# Patient Record
Sex: Male | Born: 1965 | Race: White | Hispanic: No | Marital: Married | Smoking: Never smoker
Health system: Southern US, Community
[De-identification: ages and names within clinical notes are randomized; demographics above are authoritative.]

## PROBLEM LIST (undated history)

## (undated) DIAGNOSIS — K5792 Diverticulitis of intestine, part unspecified, without perforation or abscess without bleeding: Secondary | ICD-10-CM

## (undated) DIAGNOSIS — J301 Allergic rhinitis due to pollen: Secondary | ICD-10-CM

## (undated) HISTORY — DX: Allergic rhinitis due to pollen: J30.1

## (undated) HISTORY — DX: Diverticulitis of intestine, part unspecified, without perforation or abscess without bleeding: K57.92

---

## 2010-03-31 ENCOUNTER — Emergency Department: Payer: Self-pay | Admitting: Emergency Medicine

## 2011-01-07 ENCOUNTER — Emergency Department: Payer: Self-pay | Admitting: *Deleted

## 2012-03-05 ENCOUNTER — Emergency Department: Payer: Self-pay | Admitting: Emergency Medicine

## 2012-03-05 LAB — RAPID INFLUENZA A&B ANTIGENS

## 2013-12-08 ENCOUNTER — Emergency Department: Payer: Self-pay | Admitting: Emergency Medicine

## 2013-12-08 LAB — CBC
HCT: 41.9 % (ref 40.0–52.0)
HGB: 13.7 g/dL (ref 13.0–18.0)
MCH: 29.2 pg (ref 26.0–34.0)
MCHC: 32.6 g/dL (ref 32.0–36.0)
MCV: 90 fL (ref 80–100)
Platelet: 253 10*3/uL (ref 150–440)
RBC: 4.68 10*6/uL (ref 4.40–5.90)
RDW: 13.7 % (ref 11.5–14.5)
WBC: 9.5 10*3/uL (ref 3.8–10.6)

## 2013-12-08 LAB — BASIC METABOLIC PANEL
Anion Gap: 6 — ABNORMAL LOW (ref 7–16)
BUN: 14 mg/dL (ref 7–18)
Calcium, Total: 8.4 mg/dL — ABNORMAL LOW (ref 8.5–10.1)
Chloride: 107 mmol/L (ref 98–107)
Co2: 30 mmol/L (ref 21–32)
Creatinine: 0.77 mg/dL (ref 0.60–1.30)
EGFR (African American): >60
EGFR (Non-African Amer.): >60
Glucose: 146 mg/dL — ABNORMAL HIGH (ref 65–99)
Osmolality: 288 (ref 275–301)
Potassium: 4.1 mmol/L (ref 3.5–5.1)
Sodium: 143 mmol/L (ref 136–145)

## 2013-12-08 LAB — TROPONIN I: Troponin-I: 0.03 ng/mL

## 2013-12-08 LAB — PRO B NATRIURETIC PEPTIDE: B-Type Natriuretic Peptide: 50 pg/mL (ref 0–125)

## 2016-01-04 ENCOUNTER — Emergency Department
Admission: EM | Admit: 2016-01-04 | Discharge: 2016-01-04 | Disposition: A | Discharge status: Home / Self Care | Payer: Self-pay | Attending: Emergency Medicine | Admitting: Emergency Medicine

## 2016-01-04 ENCOUNTER — Encounter: Payer: Self-pay | Admitting: *Deleted

## 2016-01-04 DIAGNOSIS — R509 Fever, unspecified: Secondary | ICD-10-CM | POA: Insufficient documentation

## 2016-01-04 DIAGNOSIS — R42 Dizziness and giddiness: Secondary | ICD-10-CM | POA: Insufficient documentation

## 2016-01-04 DIAGNOSIS — R197 Diarrhea, unspecified: Secondary | ICD-10-CM | POA: Insufficient documentation

## 2016-01-04 DIAGNOSIS — Z5321 Procedure and treatment not carried out due to patient leaving prior to being seen by health care provider: Secondary | ICD-10-CM | POA: Insufficient documentation

## 2016-01-04 LAB — COMPREHENSIVE METABOLIC PANEL
ALT: 35 U/L (ref 17–63)
AST: 34 U/L (ref 15–41)
Albumin: 3.9 g/dL (ref 3.5–5.0)
Alkaline Phosphatase: 100 U/L (ref 38–126)
Anion gap: 10 (ref 5–15)
BUN: 13 mg/dL (ref 6–20)
CO2: 26 mmol/L (ref 22–32)
Calcium: 8.3 mg/dL — ABNORMAL LOW (ref 8.9–10.3)
Chloride: 97 mmol/L — ABNORMAL LOW (ref 101–111)
Creatinine, Ser: 0.67 mg/dL (ref 0.61–1.24)
GFR calc Af Amer: >60 mL/min (ref >60)
GFR calc non Af Amer: >60 mL/min (ref >60)
Glucose, Bld: 278 mg/dL — ABNORMAL HIGH (ref 65–99)
Potassium: 3.7 mmol/L (ref 3.5–5.1)
Sodium: 133 mmol/L — ABNORMAL LOW (ref 135–145)
Total Bilirubin: 0.8 mg/dL (ref 0.3–1.2)
Total Protein: 7.5 g/dL (ref 6.5–8.1)

## 2016-01-04 LAB — CBC
HCT: 43.6 % (ref 40.0–52.0)
Hemoglobin: 15.1 g/dL (ref 13.0–18.0)
MCH: 30.2 pg (ref 26.0–34.0)
MCHC: 34.6 g/dL (ref 32.0–36.0)
MCV: 87.3 fL (ref 80.0–100.0)
Platelets: 189 10*3/uL (ref 150–440)
RBC: 5 MIL/uL (ref 4.40–5.90)
RDW: 14.1 % (ref 11.5–14.5)
WBC: 6 10*3/uL (ref 3.8–10.6)

## 2016-01-04 LAB — LIPASE, BLOOD: Lipase: 19 U/L (ref 11–51)

## 2016-01-04 NOTE — ED Provider Notes (Signed)
Nurse notified me that patient left without being seen.  I had not seen him, I reviewed available labs and patient not in room when I went to evaluate/examine.   Governor Rooksebecca Elyjah Hazan, MD 01/04/16 [phone]

## 2016-01-04 NOTE — ED Notes (Signed)
Lab still needs urine sample, showing recieved by accident

## 2016-01-04 NOTE — ED Triage Notes (Signed)
Pt reports nausea, vomiting diarrhea and fever starting yesterday, pt complains of dizziness

## 2016-01-04 NOTE — ED Notes (Addendum)
Patient stated "he is tired of waiting", wife states "we have been waiting too long, we can just go home". This RN tried to encourage patient to wait for MD, he refused, and left without being seen after triage, MD made aware.

## 2016-01-09 ENCOUNTER — Telehealth: Payer: Self-pay | Admitting: Emergency Medicine

## 2016-01-09 NOTE — Telephone Encounter (Signed)
Called patient due to lwot to inquire about condition and follow up plans. Left message.   

## 2016-04-29 ENCOUNTER — Emergency Department
Admission: EM | Admit: 2016-04-29 | Discharge: 2016-04-29 | Disposition: A | Discharge status: Home / Self Care | Payer: Self-pay | Attending: Student in an Organized Health Care Education/Training Program | Admitting: Student in an Organized Health Care Education/Training Program

## 2016-04-29 ENCOUNTER — Encounter: Payer: Self-pay | Admitting: *Deleted

## 2016-04-29 ENCOUNTER — Emergency Department: Payer: Self-pay

## 2016-04-29 DIAGNOSIS — J069 Acute upper respiratory infection, unspecified: Secondary | ICD-10-CM | POA: Insufficient documentation

## 2016-04-29 DIAGNOSIS — B9789 Other viral agents as the cause of diseases classified elsewhere: Secondary | ICD-10-CM

## 2016-04-29 MED ORDER — AMOXICILLIN 500 MG PO TABS: 500.0000 mg | ORAL_TABLET | Freq: Three times a day (TID) | ORAL | 0 refills | Status: DC

## 2016-04-29 MED ORDER — ALBUTEROL SULFATE HFA 108 (90 BASE) MCG/ACT IN AERS: 2.0000 | INHALATION_SPRAY | RESPIRATORY_TRACT | 1 refills | Status: DC | PRN

## 2016-04-29 MED ORDER — ALBUTEROL SULFATE (2.5 MG/3ML) 0.083% IN NEBU
2.5000 mg | INHALATION_SOLUTION | Freq: Once | RESPIRATORY_TRACT | Status: AC
  Administered 2016-04-29: 2.5 mg via RESPIRATORY_TRACT
  Filled 2016-04-29: qty 3

## 2016-04-29 NOTE — ED Triage Notes (Signed)
States he was diagnosed with the flu last week, states he now has sinus pain and ear pain, awake and alert in no acute distress

## 2016-04-29 NOTE — Discharge Instructions (Signed)
Follow up with the primary care provider for symptoms that are not improving over the next few days. ° °Return to the ER for symptoms that change or worsen if unable to schedule an appointment. °

## 2016-04-29 NOTE — ED Provider Notes (Signed)
[facility] Emergency Department Provider Note  ____________________________________________  Time seen: Approximately 8:40 AM  I have reviewed the triage vital signs and the nursing notes.   HISTORY  Chief Complaint Nasal Congestion and Cough   HPI Dustin Mcintyre is a 51 y.o. male who presents to the emergency department for evaluation of cough. He was diagnosed with influenza last week. His mother in all who lives at his house is being admitted for pneumonia and the entire family was "told to come here to be checked out."   History reviewed. No pertinent past medical history.  There are no active problems to display for this patient.   History reviewed. No pertinent surgical history.  Prior to Admission medications   Medication Sig Start Date End Date Taking? Authorizing Provider  albuterol (PROVENTIL HFA;VENTOLIN HFA) 108 (90 Base) MCG/ACT inhaler Inhale 2 puffs into the lungs every 4 (four) hours as needed for wheezing or shortness of breath. 04/29/16   Chinita Pesterari B Tywon Niday, FNP  amoxicillin (AMOXIL) 500 MG tablet Take 1 tablet (500 mg total) by mouth 3 (three) times daily. 04/29/16   Chinita Pesterari B Albirta Rhinehart, FNP    Allergies Patient has no known allergies.  History reviewed. No pertinent family history.  Social History Social History  Substance Use Topics  . Smoking status: Never Smoker  . Smokeless tobacco: Not on file  . Alcohol use No    Review of Systems Constitutional: Positive for fever/chills ENT: Negative sore throat. Cardiovascular: Denies chest pain. Respiratory: Negative for shortness of breath. Positive for cough. Gastrointestinal: Negative for nausea,  Negative for vomiting.  No diarrhea.  Musculoskeletal: Negative for body aches Skin: Positive for lesions on lips Neurological: Negative for headaches ____________________________________________   PHYSICAL EXAM:  VITAL SIGNS: ED Triage Vitals [04/29/16 0758]  Enc Vitals Group   BP (!) 144/91     Pulse Rate 76     Resp 18     Temp 98.5 F (36.9 C)     Temp Source Oral     SpO2 95 %     Weight (!) 350 lb (158.8 kg)     Height 5\' 8"  (1.727 m)     Head Circumference      Peak Flow      Pain Score 5     Pain Loc      Pain Edu?      Excl. in GC?     Constitutional: Alert and oriented. Well appearing and in no acute distress. Eyes: Conjunctivae are normal. EOMI. Ears: Bilateral TM normal. Nose: Nasal congestion noted; no rhinnorhea. Tenderness over the maxillary sinuses bilaterally, worse on the right. Mouth/Throat: Mucous membranes are moist.  Oropharynx normal. Tonsils without exudate. Neck: No stridor.  Lymphatic: No cervical lymphadenopathy. Cardiovascular: Normal rate, regular rhythm. Good peripheral circulation. Respiratory: Normal respiratory effort.  No retractions. Expiratory wheezes noted throughout. Gastrointestinal: Soft and nontender.  Musculoskeletal: FROM x 4 extremities.  Neurologic:  Normal speech and language.  Skin:  Skin is warm, dry and intact. No rash noted. Psychiatric: Mood and affect are normal. Speech and behavior are normal.  ____________________________________________   LABS (all labs ordered are listed, but only abnormal results are displayed)  Labs Reviewed - No data to display ____________________________________________  EKG  Not indicated. ____________________________________________  RADIOLOGY  Chest x-ray negative for acute abnormality. ____________________________________________   PROCEDURES  Procedure(s) performed: None  Critical Care performed: No ____________________________________________   INITIAL IMPRESSION / ASSESSMENT AND PLAN / ED COURSE  51 year old male presenting to  the emergency department to rule out pneumonia. Chest x-ray negative for sign of infiltrate. Patient received albuterol nebulized treatment while in the emergency department with scant decrease in wheezes. He'll be given  prescription for albuterol. He was encouraged to follow up with the primary care provider if his choice for symptoms that are not improving over the next few days. He is instructed to return to emergency department for symptoms change or worsen if he is unable schedule an appointment.  Pertinent labs & imaging results that were available during my care of the patient were reviewed by me and considered in my medical decision making (see chart for details).  Discharge Medication List as of 04/29/2016  9:25 AM    START taking these medications   Details  albuterol (PROVENTIL HFA;VENTOLIN HFA) 108 (90 Base) MCG/ACT inhaler Inhale 2 puffs into the lungs every 4 (four) hours as needed for wheezing or shortness of breath., Starting Tue 04/29/2016, Print        If controlled substance prescribed during this visit, 12 month history viewed on the [place] prior to issuing an initial prescription for Schedule II or II opiod. ____________________________________________   FINAL CLINICAL IMPRESSION(S) / ED DIAGNOSES  Final diagnoses:  Viral URI with cough    Note:  This document was prepared using Dragon voice recognition software and may include unintentional dictation errors.     Chinita Pester, FNP 04/30/16 1610    Willy Eddy, MD 04/30/16 1501

## 2016-04-29 NOTE — ED Notes (Signed)
Pt to ed with c/o diagnosed with flu last week,  Reports he used z pack but then started with fever, and cough again on Saturday PM.

## 2017-03-17 ENCOUNTER — Emergency Department
Admission: EM | Admit: 2017-03-17 | Discharge: 2017-03-17 | Disposition: A | Discharge status: Home / Self Care | Payer: Self-pay | Attending: Emergency Medicine | Admitting: Emergency Medicine

## 2017-03-17 ENCOUNTER — Encounter: Payer: Self-pay | Admitting: Medical Oncology

## 2017-03-17 ENCOUNTER — Emergency Department: Payer: Self-pay

## 2017-03-17 DIAGNOSIS — J209 Acute bronchitis, unspecified: Secondary | ICD-10-CM | POA: Insufficient documentation

## 2017-03-17 LAB — CBC WITH DIFFERENTIAL/PLATELET
Basophils Absolute: 0.1 10*3/uL (ref 0–0.1)
Basophils Relative: 1 %
Eosinophils Absolute: 0.2 10*3/uL (ref 0–0.7)
Eosinophils Relative: 3 %
HCT: 42.4 % (ref 40.0–52.0)
Hemoglobin: 14.3 g/dL (ref 13.0–18.0)
Lymphocytes Relative: 29 %
Lymphs Abs: 2.6 10*3/uL (ref 1.0–3.6)
MCH: 29.8 pg (ref 26.0–34.0)
MCHC: 33.8 g/dL (ref 32.0–36.0)
MCV: 88.2 fL (ref 80.0–100.0)
Monocytes Absolute: 0.8 10*3/uL (ref 0.2–1.0)
Monocytes Relative: 8 %
Neutro Abs: 5.5 10*3/uL (ref 1.4–6.5)
Neutrophils Relative %: 59 %
Platelets: 263 10*3/uL (ref 150–440)
RBC: 4.81 MIL/uL (ref 4.40–5.90)
RDW: 13.7 % (ref 11.5–14.5)
WBC: 9.2 10*3/uL (ref 3.8–10.6)

## 2017-03-17 LAB — COMPREHENSIVE METABOLIC PANEL
ALT: 49 U/L (ref 17–63)
AST: 43 U/L — ABNORMAL HIGH (ref 15–41)
Albumin: 4.1 g/dL (ref 3.5–5.0)
Alkaline Phosphatase: 108 U/L (ref 38–126)
Anion gap: 11 (ref 5–15)
BUN: 15 mg/dL (ref 6–20)
CO2: 25 mmol/L (ref 22–32)
Calcium: 8.9 mg/dL (ref 8.9–10.3)
Chloride: 103 mmol/L (ref 101–111)
Creatinine, Ser: 0.59 mg/dL — ABNORMAL LOW (ref 0.61–1.24)
GFR calc Af Amer: >60 mL/min (ref >60)
GFR calc non Af Amer: >60 mL/min (ref >60)
Glucose, Bld: 188 mg/dL — ABNORMAL HIGH (ref 65–99)
Potassium: 3.8 mmol/L (ref 3.5–5.1)
Sodium: 139 mmol/L (ref 135–145)
Total Bilirubin: 0.6 mg/dL (ref 0.3–1.2)
Total Protein: 7.7 g/dL (ref 6.5–8.1)

## 2017-03-17 LAB — INFLUENZA PANEL BY PCR (TYPE A & B)
Influenza A By PCR: NEGATIVE
Influenza B By PCR: NEGATIVE

## 2017-03-17 MED ORDER — AZITHROMYCIN 250 MG PO TABS: ORAL_TABLET | ORAL | 0 refills | Status: AC

## 2017-03-17 MED ORDER — PREDNISONE 50 MG PO TABS: ORAL_TABLET | ORAL | 0 refills | Status: DC

## 2017-03-17 NOTE — ED Provider Notes (Signed)
[facility] Emergency Department Provider Note  ____________________________________________  Time seen: Approximately 6:24 PM  I have reviewed the triage vital signs and the nursing notes.   HISTORY  Chief Complaint Influenza    HPI Dustin Mcintyre is a 52 y.o. male presenting to the emergency department with 3 weeks of productive cough, fever, intermittent diarrhea, myalgias and vertigo.  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  Patient reports that numerous household members have had similar symptoms.  Patient reports that he originally sought care at urgent care, who diagnosed him with a viral upper respiratory tract infection.  He reports intermittent shortness of breath and chest tightness but no nausea, vomiting or abdominal pain.  Patient has been taking Sudafed.   History reviewed. No pertinent past medical history.  There are no active problems to display for this patient.   History reviewed. No pertinent surgical history.  Prior to Admission medications   Medication Sig Start Date End Date Taking? Authorizing Provider  albuterol (PROVENTIL HFA;VENTOLIN HFA) 108 (90 Base) MCG/ACT inhaler Inhale 2 puffs into the lungs every 4 (four) hours as needed for wheezing or shortness of breath. 04/29/16   Triplett, Rulon Eisenmenger B, FNP  amoxicillin (AMOXIL) 500 MG tablet Take 1 tablet (500 mg total) by mouth 3 (three) times daily. 04/29/16   Triplett, Rulon Eisenmenger B, FNP  azithromycin (ZITHROMAX Z-PAK) 250 MG tablet Take 2 tablets (500 mg) on  Day 1,  followed by 1 tablet (250 mg) once daily on Days 2 through 5. 03/17/17 03/22/17  Orvil Feil, PA-C  predniSONE (DELTASONE) 50 MG tablet Take one 50 mg tablet once a day for 5 days. 03/17/17   Orvil Feil, PA-C    Allergies Patient has no known allergies.  No family history on file.  Social History Social History   Tobacco Use  . Smoking status: Never Smoker   Substance Use Topics  . Alcohol use: No  . Drug use: Not on file      Review of Systems  Constitutional: Patient has fever.  Eyes: No visual changes. No discharge ENT: Patient has congestion.  Cardiovascular: no chest pain. Respiratory: Patient has cough.  Gastrointestinal: No abdominal pain.  No nausea, no vomiting. Patient had diarrhea.  Genitourinary: Negative for dysuria. No hematuria Musculoskeletal: Patient has myalgias.  Skin: Negative for rash, abrasions, lacerations, ecchymosis. Neurological: Patient has headache, no focal weakness or numbness.     ____________________________________________   PHYSICAL EXAM:  VITAL SIGNS: ED Triage Vitals  Enc Vitals Group     BP 03/17/17 1725 (!) 143/89     Pulse Rate 03/17/17 1725 82     Resp 03/17/17 1725 20     Temp 03/17/17 1725 98.2 F (36.8 C)     Temp Source 03/17/17 1725 Oral     SpO2 03/17/17 1725 96 %     Weight 03/17/17 1725 (!) 360 lb (163.3 kg)     Height 03/17/17 1725 5\' 8"  (1.727 m)     Head Circumference --      Peak Flow --      Pain Score 03/17/17 1727 7     Pain Loc --      Pain Edu? --      Excl. in GC? --      Constitutional: Alert and oriented. Patient is lying supine. Eyes: Conjunctivae are normal. PERRL. EOMI. Head: Atraumatic. ENT:      Ears: Tympanic membranes are mildly injected with  mild effusion bilaterally.       Nose: No congestion/rhinnorhea.      Mouth/Throat: Mucous membranes are moist. Posterior pharynx is mildly erythematous.  Hematological/Lymphatic/Immunilogical: No cervical lymphadenopathy.  Cardiovascular: Normal rate, regular rhythm. Normal S1 and S2.  Good peripheral circulation. Respiratory: Normal respiratory effort without tachypnea or retractions. Lungs CTAB. Good air entry to the bases with no decreased or absent breath sounds. Gastrointestinal: Bowel sounds 4 quadrants. Soft and nontender to palpation. No guarding or rigidity. No palpable masses. No distention. No  CVA tenderness. Musculoskeletal: Full range of motion to all extremities. No gross deformities appreciated. Neurologic:  Normal speech and language. No gross focal neurologic deficits are appreciated.  Skin:  Skin is warm, dry and intact. No rash noted. Psychiatric: Mood and affect are normal. Speech and behavior are normal. Patient exhibits appropriate insight and judgement.    ____________________________________________   LABS (all labs ordered are listed, but only abnormal results are displayed)  Labs Reviewed  COMPREHENSIVE METABOLIC PANEL - Abnormal; Notable for the following components:      Result Value   Glucose, Bld 188 (*)    Creatinine, Ser 0.59 (*)    AST 43 (*)    All other components within normal limits  INFLUENZA PANEL BY PCR (TYPE A & B)  CBC WITH DIFFERENTIAL/PLATELET   ____________________________________________  EKG   ____________________________________________  RADIOLOGY Geraldo Pitter, personally viewed and evaluated these images (plain radiographs) as part of my medical decision making, as well as reviewing the written report by the radiologist.    Dg Chest 2 View  Result Date: 03/17/2017 CLINICAL DATA:  52 year old male with fever, vomiting and diarrhea EXAM: CHEST  2 VIEW COMPARISON:  Prior chest x-ray 04/29/2016 FINDINGS: Stable borderline cardiomegaly. Mediastinal contours remain unchanged. Chronic bibasilar interstitial prominence appears similar compared to prior and is likely exaggerated by superimposed overlying soft tissues. No focal airspace consolidation, pleural effusion, pulmonary edema or pneumothorax. No acute osseous abnormality. IMPRESSION: Stable chest x-ray without evidence of acute cardiopulmonary process. Electronically Signed   By: Malachy Moan M.D.   On: 03/17/2017 18:47    ____________________________________________    PROCEDURES  Procedure(s) performed:    Procedures    Medications - No data to  display   ____________________________________________   INITIAL IMPRESSION / ASSESSMENT AND PLAN / ED COURSE  Pertinent labs & imaging results that were available during my care of the patient were reviewed by me and considered in my medical decision making (see chart for details).  Review of the [place] CSRS was performed in accordance of the [facility] prior to dispensing any controlled drugs.     Assessment and Plan: Acute bronchitis Patient presented to the emergency department with cough productive for purulent sputum production and shortness of breath after having URI symptoms for the past 3 weeks.  Differential diagnosis included community-acquired pneumonia, acute bronchitis, viral URI and vertigo.  CBC and CMP were conducted given vertigo symptoms.  CBC and CMP were reassuring.  Patient was treated empirically for acute bronchitis with azithromycin and prednisone.  Vital signs were reassuring prior to discharge.  All patient questions were answered.    ____________________________________________  FINAL CLINICAL IMPRESSION(S) / ED DIAGNOSES  Final diagnoses:  Acute bronchitis, unspecified organism      NEW MEDICATIONS STARTED DURING THIS VISIT:  ED Discharge Orders        Ordered    azithromycin (ZITHROMAX Z-PAK) 250 MG tablet     03/17/17 1945    predniSONE (DELTASONE) 50  MG tablet     03/17/17 1945          This chart was dictated using voice recognition software/Dragon. Despite best efforts to proofread, errors can occur which can change the meaning. Any change was purely unintentional.    Orvil FeilWoods, Jaclyn M, PA-C 03/17/17 2351    Sharyn CreamerQuale, Mark, MD 03/18/17 [phone]

## 2017-03-17 NOTE — ED Notes (Signed)
See triage note  Presents with stomach virus that started about 3 weeks ago.now having subjective fever and vomiting  Last time vomited was at 11 am  And last diarrhea was yesterday   Afebrile on arrival

## 2017-03-17 NOTE — ED Notes (Signed)
Patient transported to X-ray 

## 2017-03-17 NOTE — ED Triage Notes (Addendum)
Flu like sx's for over a week. Reports of sinus pressure and headache.

## 2017-06-08 ENCOUNTER — Emergency Department
Admission: EM | Admit: 2017-06-08 | Discharge: 2017-06-08 | Disposition: A | Discharge status: Home / Self Care | Payer: Self-pay | Attending: Emergency Medicine | Admitting: Emergency Medicine

## 2017-06-08 ENCOUNTER — Other Ambulatory Visit: Payer: Self-pay

## 2017-06-08 ENCOUNTER — Encounter: Payer: Self-pay | Admitting: Emergency Medicine

## 2017-06-08 ENCOUNTER — Emergency Department: Payer: Self-pay

## 2017-06-08 DIAGNOSIS — J40 Bronchitis, not specified as acute or chronic: Secondary | ICD-10-CM | POA: Insufficient documentation

## 2017-06-08 DIAGNOSIS — Z79899 Other long term (current) drug therapy: Secondary | ICD-10-CM | POA: Insufficient documentation

## 2017-06-08 DIAGNOSIS — J069 Acute upper respiratory infection, unspecified: Secondary | ICD-10-CM | POA: Insufficient documentation

## 2017-06-08 LAB — GROUP A STREP BY PCR: Group A Strep by PCR: NOT DETECTED

## 2017-06-08 MED ORDER — ALBUTEROL SULFATE HFA 108 (90 BASE) MCG/ACT IN AERS: 2.0000 | INHALATION_SPRAY | Freq: Four times a day (QID) | RESPIRATORY_TRACT | 0 refills | Status: DC | PRN

## 2017-06-08 MED ORDER — GUAIFENESIN ER 600 MG PO TB12: 600.0000 mg | ORAL_TABLET | Freq: Two times a day (BID) | ORAL | 0 refills | Status: AC

## 2017-06-08 MED ORDER — AMOXICILLIN-POT CLAVULANATE 875-125 MG PO TABS: 1.0000 | ORAL_TABLET | Freq: Two times a day (BID) | ORAL | 0 refills | Status: AC

## 2017-06-08 NOTE — ED Notes (Signed)
ED Provider at bedside. 

## 2017-06-08 NOTE — ED Notes (Signed)
Pt states he has difficulty breathing, throat swollen, HA over eyes, and ears are ringing. Symptoms started on Thursday. Pt is alert and oriented x 4.

## 2017-06-08 NOTE — ED Notes (Signed)
Pt alert and oriented X4, active, cooperative, pt in NAD. RR even and unlabored, color WNL.  Pt informed to return if any life threatening symptoms occur.  Discharge and followup instructions reviewed.  

## 2017-06-08 NOTE — ED Provider Notes (Signed)
[facility] Emergency Department Provider Note  Time seen: 7:19 AM  I have reviewed the triage vital signs and the nursing notes.   HISTORY  Chief Complaint Cough; Ear Fullness; and Nasal Congestion    HPI Dustin Mcintyre is a 52 y.o. male with no significant past medical history, presents to the emergency department for cough, congestion subjective fever.  According to the patient over the past 3-4 days he has had subjective fever cough congestion and is throat ears and chest.  States he is now getting out a very yellow-green sputum when he coughs.  Noticed he was wheezing at home.  Denies any history of COPD or asthma.  Denies any chest pain or abdominal pain, vomiting or diarrhea, largely negative review of systems otherwise.   History reviewed. No pertinent past medical history.  There are no active problems to display for this patient.   History reviewed. No pertinent surgical history.  Prior to Admission medications   Medication Sig Start Date End Date Taking? Authorizing Provider  albuterol (PROVENTIL HFA;VENTOLIN HFA) 108 (90 Base) MCG/ACT inhaler Inhale 2 puffs into the lungs every 4 (four) hours as needed for wheezing or shortness of breath. 04/29/16   Triplett, Rulon Eisenmengerari B, FNP  amoxicillin (AMOXIL) 500 MG tablet Take 1 tablet (500 mg total) by mouth 3 (three) times daily. 04/29/16   Triplett, Rulon Eisenmengerari B, FNP  predniSONE (DELTASONE) 50 MG tablet Take one 50 mg tablet once a day for 5 days. 03/17/17   Orvil FeilWoods, Jaclyn M, PA-C    No Known Allergies  History reviewed. No pertinent family history.  Social History Social History   Tobacco Use  . Smoking status: Never Smoker  . Smokeless tobacco: Current User    Types: Chew  Substance Use Topics  . Alcohol use: No  . Drug use: Never    Review of Systems Constitutional: Subjective fever Eyes: Negative for visual complaints ENT: Nasal and chest congestion.  Ear pressure. Cardiovascular: Negative for chest  pain. Respiratory: Mild shortness of breath.  Positive for cough. Gastrointestinal: Negative for abdominal pain, vomiting  Genitourinary: Negative for urinary compaints Musculoskeletal: Negative for leg pain or swelling Skin: Negative for skin complaints  Neurological: Negative for headache All other ROS negative  ____________________________________________   PHYSICAL EXAM:  VITAL SIGNS: ED Triage Vitals  Enc Vitals Group     BP 06/08/17 0432 (!) 141/76     Pulse Rate 06/08/17 0432 88     Resp 06/08/17 0432 18     Temp 06/08/17 0432 98.6 F (37 C)     Temp Source 06/08/17 0432 Oral     SpO2 06/08/17 0432 95 %     Weight 06/08/17 0433 (!) 370 lb (167.8 kg)     Height 06/08/17 0433 5\' 8"  (1.727 m)     Head Circumference --      Peak Flow --      Pain Score 06/08/17 0433 6     Pain Loc --      Pain Edu? --      Excl. in GC? --     Constitutional: Alert and oriented. Well appearing and in no distress. Eyes: Normal exam ENT   Head: Normocephalic and atraumatic.  Normal appearing tympanic membranes.   Nose: Moderate congestion.   Mouth/Throat: Mucous membranes are moist. Cardiovascular: Normal rate, regular rhythm. No murmur Respiratory: Normal respiratory effort without tachypnea nor retractions.  Slight expiratory wheeze bilaterally.  Frequent cough during exam. Gastrointestinal: Soft and nontender. No distention.  Obese. Musculoskeletal: Nontender with normal range of motion in all extremities. No lower extremity tenderness Neurologic:  Normal speech and language. No gross focal neurologic deficits  Skin:  Skin is warm, dry and intact.  Psychiatric: Mood and affect are normal. ____________________________________________   RADIOLOGY  Mild bronchitic changes on chest x-ray  ____________________________________________   INITIAL IMPRESSION / ASSESSMENT AND PLAN / ED COURSE  Pertinent labs & imaging results that were available during my care of the  patient were reviewed by me and considered in my medical decision making (see chart for details).  Patient presents the emergency department for 4 days of subjective fever cough congestion with sputum production.  Differential includes upper respiratory infection, allergies, pneumonia, streptococcal infection.  Strep swab negative.  Chest x-ray negative besides bronchitic changes.  Highly suspect bronchitis.  Given the patient's slightly lower O2 saturation on room air 92%, currently 96% during my evaluation as well as slight expiratory wheeze cough congestion we will cover with antibiotics, placed on guaifenesin, albuterol and have the patient follow-up with his doctor in the next several days for recheck/reevaluation.  Patient agreeable to this plan of care.  Discussed return precautions.  ____________________________________________   FINAL CLINICAL IMPRESSION(S) / ED DIAGNOSES  Upper respiratory infection Bronchitis    Minna Antis, MD 06/08/17 [phone]

## 2017-06-08 NOTE — ED Triage Notes (Signed)
Patient ambulatory to triage with complaints of productive cough (yellow), nasal congestion, and ears "cracking" since Friday. Constant aching in throat 6/10.  Pt reports s/sx started with chills and fever.  Pt reports taking OTC medications with little effect.   Pt here with wife having similar s/sx. Speaking in complete coherent sentences. No acute breathing distress noted.

## 2017-11-02 ENCOUNTER — Other Ambulatory Visit: Payer: Self-pay

## 2017-11-02 ENCOUNTER — Emergency Department
Admission: EM | Admit: 2017-11-02 | Discharge: 2017-11-02 | Disposition: A | Discharge status: Home / Self Care | Payer: Self-pay | Attending: Emergency Medicine | Admitting: Emergency Medicine

## 2017-11-02 ENCOUNTER — Encounter: Payer: Self-pay | Admitting: Emergency Medicine

## 2017-11-02 DIAGNOSIS — Y9389 Activity, other specified: Secondary | ICD-10-CM | POA: Insufficient documentation

## 2017-11-02 DIAGNOSIS — Y929 Unspecified place or not applicable: Secondary | ICD-10-CM | POA: Insufficient documentation

## 2017-11-02 DIAGNOSIS — S0502XA Injury of conjunctiva and corneal abrasion without foreign body, left eye, initial encounter: Secondary | ICD-10-CM | POA: Insufficient documentation

## 2017-11-02 DIAGNOSIS — W228XXA Striking against or struck by other objects, initial encounter: Secondary | ICD-10-CM | POA: Insufficient documentation

## 2017-11-02 DIAGNOSIS — Y999 Unspecified external cause status: Secondary | ICD-10-CM | POA: Insufficient documentation

## 2017-11-02 DIAGNOSIS — H00024 Hordeolum internum left upper eyelid: Secondary | ICD-10-CM | POA: Insufficient documentation

## 2017-11-02 MED ORDER — MOXIFLOXACIN HCL 0.5 % OP SOLN: 1.0000 [drp] | Freq: Three times a day (TID) | OPHTHALMIC | 0 refills | Status: AC

## 2017-11-02 MED ORDER — KETOROLAC TROMETHAMINE 0.5 % OP SOLN: 1.0000 [drp] | Freq: Four times a day (QID) | OPHTHALMIC | 0 refills | Status: DC

## 2017-11-02 MED ORDER — TETRACAINE HCL 0.5 % OP SOLN
1.0000 [drp] | Freq: Once | OPHTHALMIC | Status: AC
  Administered 2017-11-02: 1 [drp] via OPHTHALMIC
  Filled 2017-11-02: qty 4

## 2017-11-02 MED ORDER — FLUORESCEIN SODIUM 1 MG OP STRP
1.0000 | ORAL_STRIP | Freq: Once | OPHTHALMIC | Status: AC
  Administered 2017-11-02: 1 via OPHTHALMIC
  Filled 2017-11-02: qty 1

## 2017-11-02 MED ORDER — EYE WASH OPHTH SOLN
1.0000 [drp] | OPHTHALMIC | Status: DC | PRN
  Filled 2017-11-02: qty 118

## 2017-11-02 NOTE — ED Notes (Signed)
See triage note  Presents with pain to left eye  States he felt like he has gotten something in left eye 2 days ago  Left eye slightly irritated and watering

## 2017-11-02 NOTE — ED Provider Notes (Signed)
[facility] Emergency Department Provider Note ____________________________________________  Time seen: 1309  I have reviewed the triage vital signs and the nursing notes.  HISTORY  Chief Complaint  Eye Problem  HPI Dustin Mcintyre is a 52 y.o. male resents to the ED for 2-day complaint of left eye irritation.  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  Since that time he is flush the eye and is having persistent symptoms.  He reports excessive tearing but denies any visual disturbance, nausea, vomiting, or dizziness.  He denies any crusting or matting.  He notes intermittent foreign body sensation and tenderness to the upper lid.  No other complaints are reported.  History reviewed. No pertinent past medical history.  There are no active problems to display for this patient.  History reviewed. No pertinent surgical history.  Prior to Admission medications   Medication Sig Start Date End Date Taking? Authorizing Provider  albuterol (PROVENTIL HFA;VENTOLIN HFA) 108 (90 Base) MCG/ACT inhaler Inhale 2 puffs into the lungs every 6 (six) hours as needed for wheezing or shortness of breath. 06/08/17   Minna Antis, MD  ketorolac (ACULAR) 0.5 % ophthalmic solution Place 1 drop into the left eye 4 (four) times daily. 11/02/17   Jesiah Yerby, Charlesetta Ivory, PA-C  moxifloxacin (VIGAMOX) 0.5 % ophthalmic solution Place 1 drop into the left eye 3 (three) times daily for 7 days. 11/02/17 11/09/17  Jasimine Simms, Charlesetta Ivory, PA-C    Allergies Patient has no known allergies.  History reviewed. No pertinent family history.  Social History Social History   Tobacco Use  . Smoking status: Never Smoker  . Smokeless tobacco: Current User    Types: Chew  Substance Use Topics  . Alcohol use: No  . Drug use: Never    Review of Systems  Constitutional: Negative for fever. Eyes: Negative for visual  changes. ENT: Negative for sore throat. Cardiovascular: Negative for chest pain. Respiratory: Negative for shortness of breath. Gastrointestinal: Negative for abdominal pain, vomiting and diarrhea. Genitourinary: Negative for dysuria. Musculoskeletal: Negative for back pain. Skin: Negative for rash. Neurological: Negative for headaches, focal weakness or numbness. ____________________________________________  PHYSICAL EXAM:  VITAL SIGNS: ED Triage Vitals  Enc Vitals Group     BP 11/02/17 1224 (!) 144/91     Pulse Rate 11/02/17 1224 92     Resp 11/02/17 1224 20     Temp 11/02/17 1224 97.7 F (36.5 C)     Temp Source 11/02/17 1224 Oral     SpO2 11/02/17 1224 95 %     Weight 11/02/17 1224 (!) 350 lb (158.8 kg)     Height 11/02/17 1224 5\' 8"  (1.727 m)     Head Circumference --      Peak Flow --      Pain Score 11/02/17 1227 10     Pain Loc --      Pain Edu? --      Excl. in GC? --     Constitutional: Alert and oriented. Well appearing and in no distress. Head: Normocephalic and atraumatic. Eyes: Conjunctivae are normal. PERRL. Normal extraocular movements.  Left eye with minimal upper lid erythema noted.  Upper lid reveals a small inclusion cyst to the lateral aspect of the inner lid margin.  There is floor seen dye over the cornea at about the 2 o'clock position.  No gross foreign body is appreciated. Hematological/Lymphatic/Immunological: No preauricular lymphadenopathy. Cardiovascular:  Normal rate, regular rhythm. Normal distal pulses. Respiratory: Normal respiratory effort.  Musculoskeletal: Nontender with normal range of motion in all extremities.  Neurologic:  Normal gait without ataxia. Normal speech and language. No gross focal neurologic deficits are appreciated. Skin:  Skin is warm, dry and intact. No rash noted. ____________________________________________  PROCEDURES  Procedures Tetracaine ii gtts OS ____________________________________________  INITIAL  IMPRESSION / ASSESSMENT AND PLAN / ED COURSE  She with ED evaluation of left eye foreign body complaint.  His exam reveals a corneal abrasion to the left eyes well as an early internal hordeolum to the lid margin.  Patient will be discharged with prescriptions for Vigamox as well as Acular.  He will follow-up with his eye care professional or [facility] for ongoing symptoms. ____________________________________________  FINAL CLINICAL IMPRESSION(S) / ED DIAGNOSES  Final diagnoses:  Abrasion of left cornea, initial encounter  Hordeolum internum of left upper eyelid      Karmen Stabs, Charlesetta Ivory, PA-C 11/02/17 1315    Emily Filbert, MD 11/02/17 1429

## 2017-11-02 NOTE — ED Triage Notes (Signed)
Feels like something got in eye a couple days ago.  Pain to left eye. Watering.

## 2017-11-02 NOTE — Discharge Instructions (Addendum)
You have a superficial abrasion to the left eye at the 2 o'clock position. You also have a small stye to the inside of the upper lid. Use the eye antibiotic and eye pain medicine drops as directed. Follow-up with your eye care professional or Dr. Druscilla Brownie as needed.

## 2018-04-12 ENCOUNTER — Ambulatory Visit: Payer: Self-pay | Admitting: Family Medicine

## 2018-04-19 ENCOUNTER — Encounter (INDEPENDENT_AMBULATORY_CARE_PROVIDER_SITE_OTHER): Payer: Self-pay

## 2018-04-19 ENCOUNTER — Encounter: Payer: Self-pay | Admitting: Family Medicine

## 2018-04-19 ENCOUNTER — Ambulatory Visit: Payer: PRIVATE HEALTH INSURANCE | Admitting: Family Medicine

## 2018-04-19 DIAGNOSIS — Z23 Encounter for immunization: Secondary | ICD-10-CM | POA: Diagnosis not present

## 2018-04-19 DIAGNOSIS — Z6841 Body Mass Index (BMI) 40.0 and over, adult: Secondary | ICD-10-CM

## 2018-04-19 DIAGNOSIS — R030 Elevated blood-pressure reading, without diagnosis of hypertension: Secondary | ICD-10-CM

## 2018-04-19 DIAGNOSIS — R202 Paresthesia of skin: Secondary | ICD-10-CM

## 2018-04-19 DIAGNOSIS — Z1211 Encounter for screening for malignant neoplasm of colon: Secondary | ICD-10-CM

## 2018-04-19 DIAGNOSIS — I83811 Varicose veins of right lower extremities with pain: Secondary | ICD-10-CM

## 2018-04-19 DIAGNOSIS — R200 Anesthesia of skin: Secondary | ICD-10-CM

## 2018-04-19 NOTE — Progress Notes (Signed)
Subjective:    Patient ID: Dustin Mcintyre, male    DOB: 1966/01/26, 53 y.o.   MRN: [id number]  HPI   Patient presents to clinic to establish with PCP.  He has multiple complaint/requests.  First is a history of diverticulitis.  States he had a bout of it a few weeks ago, feels his stomach is finally getting back to normal.  Did not go to a walk-in or urgent care or ER for evaluation, self diagnosed most recent diverticulitis flareup.  Got rid of symptoms by just eating a bland diet and monitoring self.  [name] last colonoscopy was approximately 10 to 12 years ago, patient has not 100% sure.  We will get new referral in place for patient to have colonoscopy.  Patient would like gastric bypass or lap band surgery.  States he has a hard time exercising due to his job and does not feel he can lose weight any other way.  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  States while on the road he often eats at gas stations, will have hotdogs or subs/hoagies.  Patient states he does not have time to work out due to his job schedule.  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  Patient also reports pain in numbness in his right leg.  States he injured his right leg about 20 years ago, fell through a great scraping skin off the right leg.  States ever since then he has had episodes of right leg going numb off and on, seems to be getting worse even his toes will go numb now - numbness usually occurs after sitting for long periods.  Patient also noticed some varicose veins in his right lower extremity and is concerned about this.  Past Medical History:  Diagnosis Date  . Diverticulitis   . Hay fever    Social History   Tobacco Use  . Smoking status: Never Smoker  . Smokeless tobacco: Current User    Types: Chew  Substance Use Topics  . Alcohol use: Yes   History reviewed. No pertinent  surgical history.  Family History  Problem Relation Age of Onset  . Hypertension Father   . Hypertension Paternal Uncle    Review of Systems  Constitutional: Negative for chills, fatigue and fever.  HENT: Negative for congestion, ear pain, sinus pain and sore throat.   Eyes: Negative.   Respiratory: Negative for cough, shortness of breath and wheezing.   Cardiovascular: Negative for chest pain, palpitations and leg swelling.  Gastrointestinal: Negative for abdominal pain, diarrhea, nausea and vomiting. Recent diverticulitis flare.  Genitourinary: Negative for dysuria, frequency and urgency.  Musculoskeletal: +right leg pain/numbness  Skin: Negative for color change, pallor and rash.  Neurological: Negative for syncope, light-headedness and headaches.  Psychiatric/Behavioral: The patient is not nervous/anxious.       Objective:   Physical Exam Vitals signs and nursing note reviewed.  Constitutional:      General: He is not in acute distress.    Appearance: He is obese. He is not toxic-appearing.  HENT:     Head: Normocephalic and atraumatic.     Right Ear: Tympanic membrane, ear canal and external ear normal.     Left Ear: Tympanic membrane, ear canal and external ear normal.     Nose: Nose normal.     Mouth/Throat:     Mouth: Mucous membranes are moist.  Pharynx: Oropharynx is clear.  Eyes:     General: No scleral icterus.    Extraocular Movements: Extraocular movements intact.     Conjunctiva/sclera: Conjunctivae normal.     Pupils: Pupils are equal, round, and reactive to light.  Neck:     Musculoskeletal: Neck supple. No neck rigidity or muscular tenderness.  Cardiovascular:     Rate and Rhythm: Normal rate and regular rhythm.     Comments: No LE edema.  Visible varicose veins on right inner thigh.  Pulmonary:     Effort: Pulmonary effort is normal. No respiratory distress.     Breath sounds: Normal breath sounds.  Abdominal:     General: Bowel sounds are  normal. There is no distension.     Palpations: Abdomen is soft.     Tenderness: There is no abdominal tenderness.  Lymphadenopathy:     Cervical: No cervical adenopathy.  Skin:    General: Skin is warm and dry.     Coloration: Skin is not jaundiced or pale.  Neurological:     Mental Status: He is alert and oriented to person, place, and time.     Motor: No weakness.     Gait: Gait normal.     Comments: Grips, quadricep strength, dorsi plantar flexion equal and strong.  Light touch sensation intact in upper and lower extremities.   Psychiatric:        Mood and Affect: Mood normal.        Behavior: Behavior normal.    Vitals:   04/19/18 0853  BP: (!) 158/96  Pulse: 84  Resp: 18  Temp: 98.1 F (36.7 C)  SpO2: 94%   Wt Readings from Last 3 Encounters:  04/19/18 (!) 354 lb 9.6 oz (160.8 kg)  11/02/17 (!) 350 lb (158.8 kg)  06/08/17 (!) 370 lb (167.8 kg)   Body mass index is 57.23 kg/m.     Assessment & Plan:   Morbid obesity - referral to bariatric surgery given.  Long discussion with patient about healthy diet choices, and that gastric bypass or lap band surgery is not a quick or easy fix for weight loss.  Even with a gastric bypass or lap band surgery you must follow a strict diet and do exercise.  Discussed a diet with lean proteins, lots of vegetables and avoiding excess carbohydrates and sugars.  Discussed drinking plenty of water.  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  Numbness and pain of leg/varicose veins in right lower extremity - due to numbness and tingling of right leg we will refer to neurology for nerve conduction study.  We will also do vascular study of right lower extremity to evaluate blood flow.  Screening colonoscopy referral made due to patient's history of diverticulitis and not having a colonoscopy for the  past 10 to 12 years.  Tetanus booster given in clinic today.  Blood work collected in clinic today.  Elevated BP without dx of HTN - Blood pressure is a little elevated, patient states he monitors this at home and usually he runs in the 120s over 80s.  He will keep an eye on this.  And we will follow-up with blood pressure at next visit.  Patient will follow-up in approximately 4 to 6 weeks for recheck on blood pressure, weight.  Advised he can return to clinic sooner if any issues arise.

## 2018-04-21 ENCOUNTER — Telehealth: Payer: Self-pay

## 2018-04-21 DIAGNOSIS — E119 Type 2 diabetes mellitus without complications: Secondary | ICD-10-CM

## 2018-04-21 LAB — COMPREHENSIVE METABOLIC PANEL
ALT: 48 IU/L — ABNORMAL HIGH (ref 0–44)
AST: 21 IU/L (ref 0–40)
Albumin/Globulin Ratio: 1.7 (ref 1.2–2.2)
Albumin: 4.5 g/dL (ref 3.8–4.9)
Alkaline Phosphatase: 171 IU/L — ABNORMAL HIGH (ref 39–117)
BUN/Creatinine Ratio: 16 (ref 9–20)
BUN: 12 mg/dL (ref 6–24)
Bilirubin Total: 0.3 mg/dL (ref 0.0–1.2)
CO2: 26 mmol/L (ref 20–29)
Calcium: 9.7 mg/dL (ref 8.7–10.2)
Chloride: 95 mmol/L — ABNORMAL LOW (ref 96–106)
Creatinine, Ser: 0.73 mg/dL — ABNORMAL LOW (ref 0.76–1.27)
GFR calc Af Amer: 122 mL/min/{1.73_m2} (ref >59)
GFR calc non Af Amer: 106 mL/min/{1.73_m2} (ref >59)
Globulin, Total: 2.6 g/dL (ref 1.5–4.5)
Glucose: 358 mg/dL — ABNORMAL HIGH (ref 65–99)
Potassium: 5.1 mmol/L (ref 3.5–5.2)
Sodium: 137 mmol/L (ref 134–144)
Total Protein: 7.1 g/dL (ref 6.0–8.5)

## 2018-04-21 LAB — LIPID PANEL
Chol/HDL Ratio: 4.5 ratio (ref 0.0–5.0)
Cholesterol, Total: 216 mg/dL — ABNORMAL HIGH (ref 100–199)
HDL: 48 mg/dL (ref >39)
LDL Calculated: 133 mg/dL — ABNORMAL HIGH (ref 0–99)
Triglycerides: 173 mg/dL — ABNORMAL HIGH (ref 0–149)
VLDL Cholesterol Cal: 35 mg/dL (ref 5–40)

## 2018-04-21 LAB — THYROID PANEL WITH TSH
Free Thyroxine Index: 1.9 (ref 1.2–4.9)
T3 Uptake Ratio: 21 % — ABNORMAL LOW (ref 24–39)
T4, Total: 9 ug/dL (ref 4.5–12.0)
TSH: 2.15 u[IU]/mL (ref 0.450–4.500)

## 2018-04-21 LAB — CBC
Hematocrit: 44.2 % (ref 37.5–51.0)
Hemoglobin: 14.3 g/dL (ref 13.0–17.7)
MCH: 29.4 pg (ref 26.6–33.0)
MCHC: 32.4 g/dL (ref 31.5–35.7)
MCV: 91 fL (ref 79–97)
Platelets: 299 10*3/uL (ref 150–450)
RBC: 4.86 x10E6/uL (ref 4.14–5.80)
RDW: 12.7 % (ref 11.6–15.4)
WBC: 8.6 10*3/uL (ref 3.4–10.8)

## 2018-04-21 LAB — B12 AND FOLATE PANEL
Folate: 18.1 ng/mL (ref >3.0)
Vitamin B-12: 1996 pg/mL — ABNORMAL HIGH (ref 232–1245)

## 2018-04-21 LAB — HEMOGLOBIN A1C
Est. average glucose Bld gHb Est-mCnc: 278 mg/dL
Hgb A1c MFr Bld: 11.3 % — ABNORMAL HIGH (ref 4.8–5.6)

## 2018-04-21 LAB — VITAMIN D 25 HYDROXY (VIT D DEFICIENCY, FRACTURES): Vit D, 25-Hydroxy: 27 ng/mL — ABNORMAL LOW (ref 30.0–100.0)

## 2018-04-21 NOTE — Telephone Encounter (Signed)
Pt's wife called back regarding a message for patient to schedule an appt to discuss diabetes medicine for Thursday or Friday. 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  [name] wife said that her husband ate a lot of sweets and drank a lot of sodas last week when he was home from work and feels that contributed to the elevated blood sugar.  For this reason she says that patient prefers for labs to be redrawn.  [name] wife said that husband wants to keep his scheduled appointment on 05/31/18 and will return for an OV at that time since he will be in town from work.

## 2018-04-22 ENCOUNTER — Other Ambulatory Visit: Payer: Self-pay | Admitting: Family Medicine

## 2018-04-22 ENCOUNTER — Telehealth: Payer: Self-pay | Admitting: Lab

## 2018-04-22 ENCOUNTER — Telehealth: Payer: Self-pay | Admitting: Family Medicine

## 2018-04-22 DIAGNOSIS — E119 Type 2 diabetes mellitus without complications: Secondary | ICD-10-CM

## 2018-04-22 DIAGNOSIS — Z1211 Encounter for screening for malignant neoplasm of colon: Secondary | ICD-10-CM

## 2018-04-22 MED ORDER — SITAGLIPTIN PHOSPHATE 100 MG PO TABS: 100.0000 mg | ORAL_TABLET | Freq: Every day | ORAL | 1 refills | Status: DC

## 2018-04-22 MED ORDER — "PEN NEEDLES 1/2"" 29G X 12MM MISC": 1 refills | Status: DC

## 2018-04-22 MED ORDER — LIRAGLUTIDE 18 MG/3ML ~~LOC~~ SOPN: PEN_INJECTOR | SUBCUTANEOUS | 1 refills | Status: DC

## 2018-04-22 MED ORDER — METFORMIN HCL 1000 MG PO TABS: 1000.0000 mg | ORAL_TABLET | Freq: Two times a day (BID) | ORAL | 1 refills | Status: DC

## 2018-04-22 MED ORDER — EMPAGLIFLOZIN 25 MG PO TABS: 25.0000 mg | ORAL_TABLET | Freq: Every day | ORAL | 1 refills | Status: DC

## 2018-04-22 MED ORDER — BLOOD GLUCOSE METER KIT: PACK | 0 refills | Status: DC

## 2018-04-22 NOTE — Telephone Encounter (Signed)
Called Pt and spoke to wife. Pt wife stated she will tell the Pt that Dustin Mcintyre sent his 2 Rx to the pharmacy to start taking ASAP

## 2018-04-22 NOTE — Telephone Encounter (Signed)
[name] sugar was elevated in the 300s in blood work, but also his A1c was elevated.  The A1c is a measurement of a three-month average of the blood sugars.  The A1c result being over 11% indicates that his blood sugars are running over 300 almost all of the time.  Having sweets for 1 or 2 days would not make someone's A1c elevated because it is a three-month average.  He needs to start diabetes medication because his A1c does 100% indicate he is a diabetic.  If he cannot come into the office this week, I would highly recommend he come in next week.  He needs to begin checking his blood sugars at least 2 times per day, eating a low-carb diet, getting regular exercise and take diabetes medication.  I will send in diabetes medicine to the pharmacy.   Also his cologuard order is in. Please print and fax  Please fax order for glucose meter to pharmacy

## 2018-04-22 NOTE — Telephone Encounter (Signed)
Pt wife called stating that the pharmacy is doing a prior authorization on the jardiance, and she does not want her husband to take it because her and her mother took it in the past and it has bad side affects. Could you prescribe something else

## 2018-04-22 NOTE — Telephone Encounter (Signed)
Cancel Jardiance  Januvia sent instead  He really needs to come in sooner than 3/30 to discuss medications. I also would like him to take victoza 1 time a day, this is a non-insulin injection. Due to his A1c level he really needs to be on 3 different medications.  I will send this in  Being newly diagnosed with diabetes requires a lot of information and teaching so he needs sooner appt than march 30th because of his new diagnosis

## 2018-04-22 NOTE — Progress Notes (Signed)
Patient ID: Dustin Mcintyre, male   DOB: 1965/09/28, 53 y.o.   MRN: [id number]   Cologuard order in

## 2018-04-23 ENCOUNTER — Telehealth: Payer: Self-pay | Admitting: Family Medicine

## 2018-04-23 ENCOUNTER — Encounter: Payer: Self-pay | Admitting: Family Medicine

## 2018-04-23 ENCOUNTER — Ambulatory Visit: Payer: PRIVATE HEALTH INSURANCE | Admitting: Family Medicine

## 2018-04-23 VITALS — BP 142/98 | HR 87 | Temp 97.9°F | Resp 20 | Ht 66.0 in | Wt 356.6 lb

## 2018-04-23 DIAGNOSIS — R030 Elevated blood-pressure reading, without diagnosis of hypertension: Secondary | ICD-10-CM

## 2018-04-23 DIAGNOSIS — E1169 Type 2 diabetes mellitus with other specified complication: Secondary | ICD-10-CM

## 2018-04-23 DIAGNOSIS — E119 Type 2 diabetes mellitus without complications: Secondary | ICD-10-CM | POA: Insufficient documentation

## 2018-04-23 DIAGNOSIS — E785 Hyperlipidemia, unspecified: Secondary | ICD-10-CM

## 2018-04-23 MED ORDER — DULAGLUTIDE 0.75 MG/0.5ML ~~LOC~~ SOAJ: 0.7500 mg | SUBCUTANEOUS | 3 refills | Status: DC

## 2018-04-23 MED ORDER — BLOOD GLUCOSE METER KIT: PACK | 0 refills | Status: AC

## 2018-04-23 NOTE — Telephone Encounter (Signed)
Called Pt and spoke to wife, told her to have the Pt start the Metformin and Januvia now and we will work on the prior [place] for the trulicity

## 2018-04-23 NOTE — Telephone Encounter (Signed)
Start the Metformin and Januvia now  We will work on the prior authorization for the trulicity

## 2018-04-23 NOTE — Telephone Encounter (Signed)
Pt's wife called back and stated the insurance company does not have any other medications that do NOT require a PA. She would like to know if there is anything Lauren can send in today that pt can take in the mean time. Please advise.

## 2018-04-23 NOTE — Telephone Encounter (Signed)
Pt has an appt today 2 11:20 am

## 2018-04-23 NOTE — Progress Notes (Signed)
Subjective:    Patient ID: Dustin Mcintyre, male    DOB: [birth year], 53 y.o.   MRN: [id number]  HPI   Patient presents to clinic to follow-up on his blood work and discussed diabetes diagnosis.  Patient states he was shocked to learn that his blood sugar was diet his A1c was in the diabetic range.  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  Patient did some research and realized that he is diabetic and needs to do something about this or else he could die.  He wants to get his blood sugar and weight under control so we can continue driving and live a long life.  Patient is now committed to changing his diet, getting more exercise and taking diabetes medications.  Lab Results  Component Value Date   HGBA1C 11.3 (H) 04/19/2018   CMP Latest Ref Rng & Units 04/19/2018 03/17/2017 01/04/2016  Glucose 65 - 99 mg/dL 885(O) 277(A) 128(N)  BUN 6 - 24 mg/dL 12 15 13   Creatinine 0.76 - 1.27 mg/dL 8.67(E) 7.20(N) 4.70  Sodium 134 - 144 mmol/L 137 139 133(L)  Potassium 3.5 - 5.2 mmol/L 5.1 3.8 3.7  Chloride 96 - 106 mmol/L 95(L) 103 97(L)  CO2 20 - 29 mmol/L 26 25 26   Calcium 8.7 - 10.2 mg/dL 9.7 8.9 9.6(G)  Total Protein 6.0 - 8.5 g/dL 7.1 7.7 7.5  Total Bilirubin 0.0 - 1.2 mg/dL 0.3 0.6 0.8  Alkaline Phos 39 - 117 IU/L 171(H) 108 100  AST 0 - 40 IU/L 21 43(H) 34  ALT 0 - 44 IU/L 48(H) 49 35     Patient Active Problem List   Diagnosis Date Noted  . Hyperlipidemia associated with type 2 diabetes mellitus (HCC) 04/23/2018  . Elevated BP without diagnosis of hypertension 04/23/2018  . Morbid obesity (HCC) 04/23/2018  . Type 2 diabetes mellitus without complication, without long-term current use of insulin (HCC) 04/23/2018   Social History   Tobacco Use  . Smoking status: Never Smoker  . Smokeless tobacco: Current User    Types: Chew  Substance Use Topics  . Alcohol use: Yes   Review of  Systems  Constitutional: Negative for chills, fatigue and fever.  HENT: Negative for congestion, ear pain, sinus pain and sore throat.   Eyes: Negative.   Respiratory: Negative for cough, shortness of breath and wheezing.   Cardiovascular: Negative for chest pain, palpitations and leg swelling.  Gastrointestinal: Negative for abdominal pain, diarrhea, nausea and vomiting.  Genitourinary: Negative for dysuria, frequency and urgency.  Musculoskeletal: +pain in right leg/tingling off and on - chronic  Skin: Negative for color change, pallor and rash.  Neurological: Negative for syncope, light-headedness and headaches.  Psychiatric/Behavioral: The patient is not nervous/anxious.       Objective:   Physical Exam  Vitals signs and nursing note reviewed.  Constitutional:      General: He is not in acute distress.    Appearance: He is obese. He is not toxic-appearing.  HENT:     Head: Normocephalic and atraumatic.     Right Ear: Tympanic membrane, ear canal and external ear normal.     Left Ear: Tympanic membrane, ear canal and external ear normal.     Nose: Nose normal.     Mouth/Throat:     Mouth: Mucous membranes are moist.     Pharynx: Oropharynx is clear.  Eyes:  General: No scleral icterus.    Extraocular Movements: Extraocular movements intact.     Conjunctiva/sclera: Conjunctivae normal.     Pupils: Pupils are equal, round, and reactive to light.  Neck:     Musculoskeletal: Neck supple. No neck rigidity or muscular tenderness.  Cardiovascular:     Rate and Rhythm: Normal rate and regular rhythm.     Comments: No LE edema. Visible varicose veins on right inner thigh.  Pulmonary:     Effort: Pulmonary effort is normal. No respiratory distress.     Breath sounds: Normal breath sounds.  Abdominal:     General: Bowel sounds are normal. There is no distension.     Palpations: Abdomen is soft.     Tenderness: There is no abdominal tenderness.  Lymphadenopathy:      Cervical: No cervical adenopathy.  Skin:    General: Skin is warm and dry.     Coloration: Skin is not jaundiced or pale.  Neurological:     Mental Status: He is alert and oriented to person, place, and time.     Motor: No weakness.     Gait: Gait normal.     Comments: Grips, quadricep strength, dorsi plantar flexion equal and strong.  Light touch sensation intact in upper and lower extremities.   Psychiatric:        Mood and Affect: Mood normal.        Behavior: Behavior normal.   Vitals:   04/23/18 1126  BP: (!) 142/98  Pulse: 87  Resp: 20  Temp: 97.9 F (36.6 C)  SpO2: 93%   BP Readings from Last 3 Encounters:  04/23/18 (!) 142/98  04/19/18 (!) 158/96  11/02/17 (!) 148/89    Wt Readings from Last 3 Encounters:  04/23/18 (!) 356 lb 9.6 oz (161.8 kg)  04/19/18 (!) 354 lb 9.6 oz (160.8 kg)  11/02/17 (!) 350 lb (158.8 kg)   Body mass index is 57.56 kg/m.   Assessment & Plan:    A total of 40 minutes were spent face-to-face with the patient during this encounter and over half of that time was spent on counseling and coordination of care. The patient was counseled on diabetes, diabetes management, medications, healthy diet choices, monitoring blood sugars, how it is also important to focus on controlling blood pressure and cholesterol.  Type 2 diabetes- patient's A1c is 11.3%.  Due to this A1c range I discussed with patient that he must be on triple therapy.  We will do metformin 1000 mg twice daily, Januvia 100 mg daily and Trulicity 0.75 mg once per week.  Patient would prefer a once weekly injectable rather than daily injectable.  Advised that if Trulicity is not covered by insurance, her backup option would be Victoza which is a daily injectable.  Long discussion with patient and wife about healthy diet, weight loss, regular exercise and need for diabetes medication because of the level of his blood sugars.  Discussed how diabetes affects every organ in the body and blood  sugar being where his blood sugar is puts a lot of stress and strain on his body.  Elevated BP without diagnosis of hypertension- patient advised that his blood pressure is again elevated at today's visit.  I suggested patient begin a low-dose blood pressure medication, he declines at this time.  He would like to focus on his diabetes and all those new medications before anything else.  Hyperlipidemia- advised patient that due to his diabetes and higher blood pressures, he should consider  a statin medication for cholesterol management.  He declines and will work on healthy diet exercise and taking diabetes medications.  He would prefer not to have to take a statin in addition to all the new diabetes medicines.    Patient will keep follow-up as planned in March 2020 recheck on his blood sugar numbers.

## 2018-04-23 NOTE — Patient Instructions (Signed)
Check blood sugar at least 1-2 times per day.  Take metformin 1000 mg twice per day.  Take Januvia once per day.  Use Trulicity injection 1 time per week, if Trulicity is not covered by insurance we will plan to change it to Victoza.    Carbohydrate Counting for Diabetes Mellitus, Adult  Carbohydrate counting is a method of keeping track of how many carbohydrates you eat. Eating carbohydrates naturally increases the amount of sugar (glucose) in the blood. Counting how many carbohydrates you eat helps keep your blood glucose within normal limits, which helps you manage your diabetes (diabetes mellitus). It is important to know how many carbohydrates you can safely have in each meal. This is different for every person. A diet and nutrition specialist (registered dietitian) can help you make a meal plan and calculate how many carbohydrates you should have at each meal and snack. Carbohydrates are found in the following foods:  Grains, such as breads and cereals.  Dried beans and soy products.  Starchy vegetables, such as potatoes, peas, and corn.  Fruit and fruit juices.  Milk and yogurt.  Sweets and snack foods, such as cake, cookies, candy, chips, and soft drinks. How do I count carbohydrates? There are two ways to count carbohydrates in food. You can use either of the methods or a combination of both. Reading "Nutrition Facts" on packaged food The "Nutrition Facts" list is included on the labels of almost all packaged foods and beverages in the [place] It includes:  The serving size.  Information about nutrients in each serving, including the grams (g) of carbohydrate per serving. To use the "Nutrition Facts":  Decide how many servings you will have.  Multiply the number of servings by the number of carbohydrates per serving.  The resulting number is the total amount of carbohydrates that you will be having. Learning standard serving sizes of other foods When you eat carbohydrate  foods that are not packaged or do not include "Nutrition Facts" on the label, you need to measure the servings in order to count the amount of carbohydrates:  Measure the foods that you will eat with a food scale or measuring cup, if needed.  Decide how many standard-size servings you will eat.  Multiply the number of servings by 15. Most carbohydrate-rich foods have about 15 g of carbohydrates per serving. ? For example, if you eat 8 oz (170 g) of strawberries, you will have eaten 2 servings and 30 g of carbohydrates (2 servings x 15 g = 30 g).  For foods that have more than one food mixed, such as soups and casseroles, you must count the carbohydrates in each food that is included. The following list contains standard serving sizes of common carbohydrate-rich foods. Each of these servings has about 15 g of carbohydrates:   hamburger bun or  English muffin.   oz (15 mL) syrup.   oz (14 g) jelly.  1 slice of bread.  1 six-inch tortilla.  3 oz (85 g) cooked rice or pasta.  4 oz (113 g) cooked dried beans.  4 oz (113 g) starchy vegetable, such as peas, corn, or potatoes.  4 oz (113 g) hot cereal.  4 oz (113 g) mashed potatoes or  of a large baked potato.  4 oz (113 g) canned or frozen fruit.  4 oz (120 mL) fruit juice.  4-6 crackers.  6 chicken nuggets.  6 oz (170 g) unsweetened dry cereal.  6 oz (170 g) plain fat-free yogurt or  yogurt sweetened with artificial sweeteners.  8 oz (240 mL) milk.  8 oz (170 g) fresh fruit or one small piece of fruit.  24 oz (680 g) popped popcorn. Example of carbohydrate counting Sample meal  3 oz (85 g) chicken breast.  6 oz (170 g) brown rice.  4 oz (113 g) corn.  8 oz (240 mL) milk.  8 oz (170 g) strawberries with sugar-free whipped topping. Carbohydrate calculation 1. Identify the foods that contain carbohydrates: ? Rice. ? Corn. ? Milk. ? Strawberries. 2. Calculate how many servings you have of each food: ? 2  servings rice. ? 1 serving corn. ? 1 serving milk. ? 1 serving strawberries. 3. Multiply each number of servings by 15 g: ? 2 servings rice x 15 g = 30 g. ? 1 serving corn x 15 g = 15 g. ? 1 serving milk x 15 g = 15 g. ? 1 serving strawberries x 15 g = 15 g. 4. Add together all of the amounts to find the total grams of carbohydrates eaten: ? 30 g + 15 g + 15 g + 15 g = 75 g of carbohydrates total. Summary  Carbohydrate counting is a method of keeping track of how many carbohydrates you eat.  Eating carbohydrates naturally increases the amount of sugar (glucose) in the blood.  Counting how many carbohydrates you eat helps keep your blood glucose within normal limits, which helps you manage your diabetes.  A diet and nutrition specialist (registered dietitian) can help you make a meal plan and calculate how many carbohydrates you should have at each meal and snack. This information is not intended to replace advice given to you by your health care provider. Make sure you discuss any questions you have with your health care provider. Document Released: 02/17/2005 Document Revised: 08/27/2016 Document Reviewed: 08/01/2015 Elsevier Interactive Patient Education  2019 [employer]     Tips for Eating Away From Home If You Have Diabetes Controlling your blood sugar (glucose) levels can be challenging when you do not prepare your own meals. The following tips can help you manage your diabetes when you eat away from home. If you have questions or if you need help, work with your health care provider or diet and nutrition specialist (dietitian). Planning ahead Plan ahead if you know you will be eating away from home:  Try to eat your meals and snacks at about the same time each day. If you know your meal is going to be later than normal, make sure you have a small snack. Being very hungry can cause you to make unhealthy food choices.  Make a list of restaurants near you that offer healthy  choices. If a restaurant has a carry-out menu, take the menu home and plan what you will order ahead of time.  Look up the restaurant you want to eat at online. Many chain and fast-food restaurants list nutritional information online. Use this information to choose the healthiest options and to calculate how many carbohydrates will be in your meal.  Use a carbohydrate-counting book or mobile app to look up the carbohydrate content and serving size of the foods you want to eat. Free foods A "free food" is any food or drink that has less than 5 grams of carbohydrates and less than 20 calories per serving. These food are high in fiber and nutrients and low in calories, carbohydrates, and fats. Free foods include:  Non-starchy vegetables, such as carrots, broccoli, celery, lettuce, or green beans.  Non-sugar drinks, such as water, unsweetened coffee, or unsweetened tea.  Low-calorie salad dressings.  Sugar-free gelatin. Starting meals with a salad full of vegetables is a healthy choice that includes a lot of free foods. Avoid high-calorie salad toppings like bacon, cheese, and high-fat dressings. Ask for your salad dressing to be served on the side so that you dip your fork in the dressing and then in the salad. This allows you to control how much dressing you eat and still get the flavor with every bite. Choices to control carbohydrates   Ask your server to take away the bread basket or chips from your table.  Choose light yogurt or [place] yogurt instead of non-fat sweetened yogurt.  Order fresh fruit. A salad bar often offers fresh fruit choices. Avoid canned fruit because it is usually packed in sugar or syrup.  Order a salad, and ask for dressing on the side.  Ask for substitutes. For example, if your meal comes with french fries, ask for a side salad or steamed veggies instead. If a meal comes with fried chicken, ask for grilled chicken instead. Beverages  Choose drinks that are low in  calories and sugar, such as: ? Water. ? Unsweetened tea or coffee. ? Lowfat milk.  Avoid the following drinks: ? Alcoholic beverages. ? Regular (not diet) sodas. Other tips  If you take insulin, wait to take your insulin once your food arrives to your table. This will ensure that your insulin and your food are timed correctly.  Become familiar with serving sizes and learn to recognize how many servings are in a portion. Restaurant portions are typically two to three times larger than what you really need.  Ask your server for a to-go box at the beginning of the meal. When your food comes, leave the amount you should have on your plate, and put the rest in the to-go box so that you are not tempted to eat too much.  Consider splitting an entree with someone and ordering a side salad.  Avoid buffets. They are typically too tempting and result in overeating. Where to find more information  American Diabetes Association: www.diabetes.org  American Association of Diabetes Educators: www.diabeteseducator.org Summary  Plan ahead when eating away from home.  Try to eat your meals and snacks at about the same time each day. If you know your meal is going to be later than normal, make sure you have a small snack. Being very hungry can cause you to make unhealthy food choices.  Ask for substitutes. For example, if your meal comes with french fries, ask for a side salad or steamed veggies instead. If a meal comes with fried chicken, ask for grilled chicken instead.  Ask for a to-go box when you order your meal. Divide your meal before you start eating. This information is not intended to replace advice given to you by your health care provider. Make sure you discuss any questions you have with your health care provider. Document Released: 02/17/2005 Document Revised: 05/28/2016 Document Reviewed: 05/28/2016 Elsevier Interactive Patient Education  2019 [employer].

## 2018-04-23 NOTE — Telephone Encounter (Signed)
Copied from CRM [id number]. Topic: Quick Communication - See Telephone Encounter >> Apr 23, 2018  1:33 PM Dustin Mcintyre wrote: CRM for notification. See Telephone encounter for: 04/23/18.  [name] wife states insurance is requiring a PA on Dulaglutide (TRULICITY) 0.75 MG/0.5ML SOPN & the Victoza/ she said is there something else that can be called in that will not require this. She is going to call and see if they can give her a list as well. He is going back out of town again [employer] and he needs it today. Thanks    [id number] - [place], [place] - [address] AT [facility] OF [address] [place] [address] [place] [address] Phone: [phone] Fax: [phone]

## 2018-04-23 NOTE — Telephone Encounter (Signed)
Seen today for OV.

## 2018-04-27 ENCOUNTER — Telehealth: Payer: Self-pay

## 2018-04-27 NOTE — Telephone Encounter (Signed)
Disregard previous message. Pt's wife has figured out answer

## 2018-04-27 NOTE — Telephone Encounter (Signed)
Copied from CRM #[id number]. Topic: General - Other >> Apr 27, 2018 10:46 AM Percival Spanish wrote:  Pt wife said they need to see someone in [place] for Bariatric Surgery  ,do not want to travel to [place] unless there is no one in [place]

## 2018-04-28 NOTE — Telephone Encounter (Signed)
Error

## 2018-05-04 ENCOUNTER — Encounter: Payer: Self-pay | Admitting: *Deleted

## 2018-05-04 ENCOUNTER — Telehealth: Payer: Self-pay | Admitting: Lab

## 2018-05-04 DIAGNOSIS — E119 Type 2 diabetes mellitus without complications: Secondary | ICD-10-CM

## 2018-05-04 MED ORDER — LIRAGLUTIDE 18 MG/3ML ~~LOC~~ SOPN: PEN_INJECTOR | SUBCUTANEOUS | 3 refills | Status: DC

## 2018-05-04 MED ORDER — "PEN NEEDLES 1/2"" 29G X 12MM MISC": 2 refills | Status: AC

## 2018-05-04 NOTE — Telephone Encounter (Signed)
Received a fax today from Pt insurance Pt's insurance will not cover Trulicity, but they will cover Victoza

## 2018-05-05 NOTE — Telephone Encounter (Signed)
Called and spoke to [place], and faxed her over Pt lab results and OV notes

## 2018-05-05 NOTE — Telephone Encounter (Signed)
Judeth Cornfield calling from [employer] called and stated that she needs clinical information and lab regarding Victoza because this is only approved for 30 days. Please advise   [phone] Fax#[phone]

## 2018-05-28 ENCOUNTER — Telehealth: Payer: Self-pay | Admitting: Family Medicine

## 2018-05-28 NOTE — Telephone Encounter (Signed)
Can we set up web ex rather than phone call for Monday 840 AM appt? Reimbursement is more. If patient has an email address and a smart phone or computer they can do web ex

## 2018-05-28 NOTE — Telephone Encounter (Signed)
They do not have internet nor great cell service. They prefer a phone call.

## 2018-05-29 ENCOUNTER — Other Ambulatory Visit: Payer: Self-pay | Admitting: Family Medicine

## 2018-05-31 ENCOUNTER — Encounter: Payer: Self-pay | Admitting: Family Medicine

## 2018-05-31 ENCOUNTER — Other Ambulatory Visit: Payer: Self-pay

## 2018-05-31 ENCOUNTER — Telehealth: Payer: Self-pay | Admitting: Family Medicine

## 2018-05-31 ENCOUNTER — Ambulatory Visit (INDEPENDENT_AMBULATORY_CARE_PROVIDER_SITE_OTHER): Payer: PRIVATE HEALTH INSURANCE | Admitting: Family Medicine

## 2018-05-31 DIAGNOSIS — E1169 Type 2 diabetes mellitus with other specified complication: Secondary | ICD-10-CM | POA: Diagnosis not present

## 2018-05-31 DIAGNOSIS — R202 Paresthesia of skin: Secondary | ICD-10-CM

## 2018-05-31 DIAGNOSIS — E785 Hyperlipidemia, unspecified: Secondary | ICD-10-CM

## 2018-05-31 DIAGNOSIS — R030 Elevated blood-pressure reading, without diagnosis of hypertension: Secondary | ICD-10-CM

## 2018-05-31 DIAGNOSIS — E119 Type 2 diabetes mellitus without complications: Secondary | ICD-10-CM

## 2018-05-31 DIAGNOSIS — R200 Anesthesia of skin: Secondary | ICD-10-CM

## 2018-05-31 MED ORDER — GLUCOSE BLOOD VI STRP: ORAL_STRIP | 12 refills | Status: DC

## 2018-05-31 NOTE — Telephone Encounter (Signed)
Great - thanks

## 2018-05-31 NOTE — Telephone Encounter (Signed)
Called Pt and spoke with his wife. I was able to schedule the an appt for May 11th at 8:40am, for his Diabetes Follow-Up

## 2018-05-31 NOTE — Telephone Encounter (Signed)
Please call to do 6 week follow up from now for diabetes

## 2018-05-31 NOTE — Progress Notes (Signed)
Virtual Visit via Telephone Note  I connected with Dustin Mcintyre on 05/31/18 at  8:40 AM EDT by telephone and verified that I am speaking with the correct person using two identifiers.   I discussed the limitations, risks, security and privacy concerns of performing an evaluation and management service by telephone and the availability of in person appointments. I also discussed with the patient that there may be a patient responsible charge related to this service. The patient expressed understanding and agreed to proceed.  Patient present, his wife present on the call. They are located at home  Leanora Cover FNP present on the call, located at [facility].    History of Present Illness:  Phone call initiated today to have a 6-week follow-up to discuss how patient is doing since being a newly diagnosed diabetic, discuss cholesterol, numbness tingling of his legs and also his weight.  Patient had not been to a PCP in many years, at last visit we did blood work which revealed he was a diabetic due to his A1c being over 11%.  He immediately was started on medication of metformin twice daily, Januvia daily and Victoza daily.  Patient has been tolerating his medications quite well.  States his blood sugars in the morning are usually between 111 and 120.  Did have one reading of 53, felt slightly shaky.  He ate a cheese stick, and felt better.  [name] BP was elevated at last couple of visits, but he declined starting blood pressure medication.  Patient is interested in getting an electronic blood pressure cuff to keep eye on his BP readings.  He is helping with diet and exercise and improvement of his sugar if he can bring his blood pressure down.  [name] blood pressure also revealed elevated cholesterols, most likely related to his diabetes and weight.  Patient declines starting cholesterol medication, want to work on diet and exercise before adding a medication on top of the new diabetes  meds.  Patient states he knows he let himself go in regards to his weight, and has been getting back on a healthy diet.  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  Often eats unhealthy foods on the road, but now he is changed his ways to getting healthier choices if he makes a stop and also packing healthy snacks like nuts, yogurt, cheese stick and salads.   Patient is happy to report that now his blood sugars are improving, the pain and tingling in both of his legs is improved as well.  He is hopeful the longer his sugars are under good control that better his legs will feel.  Observations/Objective:  Patient sounds to be in no distress on the phone.  Talking fluently without issue, he is alert and oriented x3.  Does not sound short of breath.  Blood sugar readings reported to be between 111-120 sound very good.   Assessment and Plan:  Type 2 diabetes - patient will continue metformin twice daily, Januvia daily and Victoza daily.  He will continue to work on healthy diet and physical activity such as short walks and lifting small weights. Test strips refill sent in.   Elevated BP without diagnosis of hypertension - we will mail patient a prescription for an electronic BP cuff so he can keep track of this at home.  Patient advised that his BPs do remain elevated, most likely he will have to start a blood pressure medication.  Morbid obesity - discussed healthy food choices and a diet high in lean proteins and vegetables and low in processed carbohydrates and sugars.  Also recommended increasing physical activity by doing short walks, lifting small weights and increasing stamina as tolerated.  Hyperlipidemia- patient currently working on diet and exercise to reduce weight and in hopes reducing cholesterol levels.  Patient advised that this diet and exercise plan does not improve his cholesterol numbers, due to his diagnosis of diabetes I  would recommend he get on a cholesterol medication.  Numbness and tingling of late-bilateral numbness and tingling of the leg with right leg worse than left does seem to be improving with better sugar control.  We will continue to monitor.  Follow Up Instructions:  He will follow up in office in 6 weeks for recheck and repeat labs.    I discussed the assessment and treatment plan with the patient. The patient was provided an opportunity to ask questions and all were answered. The patient agreed with the plan and demonstrated an understanding of the instructions.   The patient was advised to call back or seek an in-person evaluation if the symptoms worsen or if the condition fails to improve as anticipated.  I provided 15 minutes of non-face-to-face time during this encounter.   Tracey Harries, FNP

## 2018-06-15 ENCOUNTER — Telehealth: Payer: Self-pay | Admitting: Family Medicine

## 2018-06-15 NOTE — Telephone Encounter (Signed)
Called Pt and spoke with Pt wife. I told the wife that Per NP Lauren Guse  last phone visit with the Pt he was doing well with changing his eating habit and using The Metformin and Januvia.  NP Lauren Guse stated the Pt was not going to start insulin at that time. Also she stated he could stop the victoza and continue with just oral metformin and oral januvia and see how things are going at his next follow up visit. Pt wife stated okay she understood and she will tell her husband 

## 2018-06-15 NOTE — Telephone Encounter (Signed)
Copied from CRM [id number]. Topic: Quick Communication - See Telephone Encounter >> Jun 15, 2018 10:32 AM Jay Schlichter wrote: CRM for notification. See Telephone encounter for: 06/15/18. Pt was prescribed victoza.  Insurance did a one time approval.  They will no longer approve it.  Injectables that are not insulin are not covered.  Insurance covers insulin and pills.  Pt does not qualify for patient assistance. They need something else called in, they cannot afford $800 month  Cb is [phone]

## 2018-06-15 NOTE — Telephone Encounter (Signed)
Called Pt and spoke with Pt wife. I told the wife that Per NP Leanora Cover  last phone visit with the Pt he was doing well with changing his eating habit and using The Metformin and Januvia.  NP Leanora Cover stated the Pt was not going to start insulin at that time. Also she stated he could stop the victoza and continue with just oral metformin and oral januvia and see how things are going at his next follow up visit. Pt wife stated okay she understood and she will tell her husband

## 2018-06-15 NOTE — Telephone Encounter (Signed)
Dustin Mcintyre with [facility] in [place] calling to report pt.'s insurance, [employer], will not pay for any further refills of Victoza, even with a PA. Requesting the practice call them and see what can be done. Prescription would cast $800 out of pocket. Number for insurance is [phone]. Please advise.

## 2018-06-15 NOTE — Telephone Encounter (Signed)
Sent to PCP to advise will pt need to reach out the their insurance to find out what alterative he could use?

## 2018-06-15 NOTE — Telephone Encounter (Signed)
Per our last phone visit, patient was doing well with changing his eating and the metformin and [place].  I do not want to start insulin at this time  I think we can stop the victoza and continue with just oral metformin and oral januvia and see how things are going at his next follow up visit.

## 2018-06-25 ENCOUNTER — Telehealth: Payer: Self-pay | Admitting: Family Medicine

## 2018-06-25 NOTE — Telephone Encounter (Signed)
Copied from CRM [id number]. Topic: Quick Communication - See Telephone Encounter >> Jun 25, 2018  2:59 PM Angela Nevin wrote: CRM for notification. See Telephone encounter for: 06/25/18.   Dustin Mcintyre, with [employer], calling to inquire if office had received fax about PA for the medication Trulicity injector pen, quantity of 2. If office has not received the fax, prior auth can be started on Cover[employer] with the key [id number]. Please advise.   CMM# [phone]

## 2018-06-25 NOTE — Telephone Encounter (Signed)
Called [employer] and told them that there was a change in the Pt therapy. Pt does not need Trulicity injector pen.

## 2018-07-07 ENCOUNTER — Other Ambulatory Visit: Payer: Self-pay

## 2018-07-07 ENCOUNTER — Encounter: Payer: Self-pay | Admitting: Family Medicine

## 2018-07-07 ENCOUNTER — Telehealth: Payer: Self-pay

## 2018-07-07 ENCOUNTER — Ambulatory Visit (INDEPENDENT_AMBULATORY_CARE_PROVIDER_SITE_OTHER): Payer: PRIVATE HEALTH INSURANCE | Admitting: Family Medicine

## 2018-07-07 DIAGNOSIS — J019 Acute sinusitis, unspecified: Secondary | ICD-10-CM | POA: Diagnosis not present

## 2018-07-07 MED ORDER — AMOXICILLIN-POT CLAVULANATE 875-125 MG PO TABS: 1.0000 | ORAL_TABLET | Freq: Two times a day (BID) | ORAL | 0 refills | Status: DC

## 2018-07-07 NOTE — Progress Notes (Signed)
Patient ID: Dustin Mcintyre, male   DOB: [birth year], 53 y.o.   MRN: [id number]    Virtual Visit via phone Note  This visit type was conducted due to national recommendations for restrictions regarding the COVID-19 pandemic (e.g. social distancing).  This format is felt to be most appropriate for this patient at this time.  All issues noted in this document were discussed and addressed.  No physical exam was performed (except for noted visual exam findings with Video Visits).   I connected with Dustin Mcintyre today at 10:20 AM EDT by telephone and verified that I am speaking with the correct person using two identifiers. Location patient: home Location provider: [place] Persons participating in the virtual visit: patient, provider  I discussed the limitations, risks, security and privacy concerns of performing an evaluation and management service by telephone and the availability of in person appointments. I also discussed with the patient that there may be a patient responsible charge related to this service. The patient expressed understanding and agreed to proceed.  HPI:  Patient and I connected via telephone today due to complaint of sinus pressure and congestion that has been present for about 10 days.  Patient does take Allegra every day for sinus issues and has started to use a over-the-counter nasal spray that is designed for allergy relief.  States the congestion and pressure in his face seems to be getting worse, and he also has ear fullness.  States when he blows his nose it is thick and green/yellow.  Denies fever or chills.  Denies cough, shortness of breath or wheezing.  Feels drainage turning the back of his throat causing him to have to clear her throat a lot.  Denies GI or GU issues.  Denies body aches.   ROS: See pertinent positives and negatives per HPI.  Past Medical History:  Diagnosis Date  . Diverticulitis   . Hay fever    Patient Active Problem List   Diagnosis Date Noted  . Hyperlipidemia associated with type 2 diabetes mellitus ([place]) 04/23/2018  . Elevated BP without diagnosis of hypertension 04/23/2018  . Morbid obesity ([place]) 04/23/2018  . Type 2 diabetes mellitus without complication, without long-term current use of insulin ([place]) 04/23/2018   Family History  Problem Relation Age of Onset  . Hypertension Father   . Hypertension Paternal Uncle    Social History   Tobacco Use  . Smoking status: Never Smoker  . Smokeless tobacco: Current User    Types: Chew  Substance Use Topics  . Alcohol use: Yes    Current Outpatient Medications:  .  Ascorbic Acid (VITAMIN C) 1000 MG tablet, Take 1,000 mg by mouth daily., Disp: , Rfl:  .  blood glucose meter kit and supplies, Dispense based on patient and insurance preference. Use up to four times daily as directed. (FOR ICD-10 E10.9, E11.9)., Disp: 1 each, Rfl: 0 .  Boswellia-Glucosamine-Vit D (OSTEO BI-FLEX ONE PER DAY PO), Take by mouth., Disp: , Rfl:  .  fexofenadine (ALLEGRA) 180 MG tablet, Take 180 mg by mouth daily., Disp: , Rfl:  .  Garlic 8938 MG CAPS, Take 1,000 mg by mouth., Disp: , Rfl:  .  glucose blood test strip, Use as instructed, Disp: 100 each, Rfl: 12 .  ibuprofen (ADVIL,MOTRIN) 800 MG tablet, Take 800 mg by mouth every 8 (eight) hours as needed., Disp: , Rfl:  .  Insulin Pen Needle (PEN NEEDLES 29GX1/2") 29G X 12MM MISC, Use daily with victoza, Disp: 100 each, Rfl: 2 .  liraglutide (VICTOZA) 18 MG/3ML SOPN, Inject 0.6 mg SubQ daily for 7 days, then increase dose to 1.2 mg SubQ daily, Disp: 5 pen, Rfl: 3 .  metFORMIN (GLUCOPHAGE) 1000 MG tablet, Take 1 tablet (1,000 mg total) by mouth 2 (two) times daily with a meal., Disp: 180 tablet, Rfl: 1 .  Multiple Vitamin (MULTIVITAMIN) tablet, Take 1 tablet by mouth daily., Disp: , Rfl:  .  Omega-3 Fatty Acids (FISH OIL) 1000 MG CAPS, Take 1,000 mg by mouth 2 (two) times daily., Disp: , Rfl:  .  omeprazole (PRILOSEC) 20 MG capsule,  Take 20 mg by mouth daily., Disp: , Rfl:  .  ONE TOUCH ULTRA TEST test strip, USE TO CHECK SUGARS UP TO FOUR TIMES DAILY AS NEEDED, Disp: 100 each, Rfl: 1 .  sitaGLIPtin (JANUVIA) 100 MG tablet, Take 1 tablet (100 mg total) by mouth daily., Disp: 90 tablet, Rfl: 1  EXAM:  GENERAL: alert, oriented, sounds well and in no acute distress  HEENT: Voice does sound congested when we are speaking over phone.   LUNGS: Speaking in full sentences. Breathing rate appears normal, no gasping or wheezing  PSYCH/NEURO: pleasant and cooperative, no obvious depression or anxiety, speech and thought processing grossly intact  ASSESSMENT AND PLAN:  Discussed the following assessment and plan:  Acute sinusitis, recurrence not specified, unspecified location - Plan: amoxicillin-clavulanate (AUGMENTIN) 875-125 MG tablet  Patient symptoms do sound consistent with acute sinusitis.  Advised to continue taking his Allegra every day as he has been doing and also continue use the allergy nasal spray.  Also suggested he try doing saline nasal rinses to help rinse out sinuses and reduce nasal congestion.  Encourage plenty of rest, increase fluid intake and good handwashing.   I discussed the assessment and treatment plan with the patient. The patient was provided an opportunity to ask questions and all were answered. The patient agreed with the plan and demonstrated an understanding of the instructions.   The patient was advised to call back or seek an in-person evaluation if the symptoms worsen or if the condition fails to improve as anticipated.   13 minutes spent on telephone with patient for this visit.   Jodelle Green, FNP

## 2018-07-07 NOTE — Telephone Encounter (Signed)
Copied from CRM [id number]. Topic: Appointment Scheduling - Scheduling Inquiry for Clinic >> Jul 07, 2018  9:00 AM Wyonia Hough E wrote: Reason for CRM: Pt wife called to schedule appt for possible sinus infection. Office not available after trying several times  / please advise

## 2018-07-12 ENCOUNTER — Ambulatory Visit: Payer: PRIVATE HEALTH INSURANCE | Admitting: Family Medicine

## 2018-08-09 LAB — HM DIABETES EYE EXAM

## 2018-08-12 ENCOUNTER — Encounter: Payer: Self-pay | Admitting: Lab

## 2018-08-12 ENCOUNTER — Telehealth: Payer: Self-pay | Admitting: Lab

## 2018-08-31 NOTE — Telephone Encounter (Signed)
Error

## 2018-09-10 ENCOUNTER — Telehealth: Payer: Self-pay

## 2018-09-10 NOTE — Telephone Encounter (Signed)
Copied from [place] [phone] [id number]. Topic: Complaint - Billing/Coding >> Sep 10, 2018  9:57 AM Lennox Solders wrote: DOS: 04-19-2018 Details of complaint: per wife the insurance does not pay for new pt appointment under the code that was billed How would the patient like to see this issue resolved? Pt wife would like code to be changed to a code that is correct. Pt wife does not know the correct code

## 2018-09-17 NOTE — Telephone Encounter (Signed)
[name] information has been sent to our coder for review and resubmission of the patients claim. I am waiting on a reply back from billing . The coder has reviewed and submitted new codes to the billers.

## 2018-09-27 ENCOUNTER — Ambulatory Visit (INDEPENDENT_AMBULATORY_CARE_PROVIDER_SITE_OTHER): Payer: PRIVATE HEALTH INSURANCE | Admitting: Family Medicine

## 2018-09-27 ENCOUNTER — Other Ambulatory Visit: Payer: Self-pay

## 2018-09-27 DIAGNOSIS — Z20822 Contact with and (suspected) exposure to covid-19: Secondary | ICD-10-CM

## 2018-09-27 DIAGNOSIS — R509 Fever, unspecified: Secondary | ICD-10-CM | POA: Diagnosis not present

## 2018-09-27 DIAGNOSIS — R5383 Other fatigue: Secondary | ICD-10-CM | POA: Diagnosis not present

## 2018-09-27 DIAGNOSIS — J019 Acute sinusitis, unspecified: Secondary | ICD-10-CM

## 2018-09-27 DIAGNOSIS — Z20828 Contact with and (suspected) exposure to other viral communicable diseases: Secondary | ICD-10-CM

## 2018-09-27 DIAGNOSIS — R059 Cough, unspecified: Secondary | ICD-10-CM

## 2018-09-27 DIAGNOSIS — R52 Pain, unspecified: Secondary | ICD-10-CM

## 2018-09-27 DIAGNOSIS — R05 Cough: Secondary | ICD-10-CM

## 2018-09-27 MED ORDER — AMOXICILLIN-POT CLAVULANATE 875-125 MG PO TABS: 1.0000 | ORAL_TABLET | Freq: Two times a day (BID) | ORAL | 0 refills | Status: DC

## 2018-09-27 NOTE — Progress Notes (Signed)
Patient ID: Dustin Mcintyre, male   DOB: 16-Nov-1965, 53 y.o.   MRN: [id number]    Virtual Visit via phone Note  This visit type was conducted due to national recommendations for restrictions regarding the COVID-19 pandemic (e.g. social distancing).  This format is felt to be most appropriate for this patient at this time.  All issues noted in this document were discussed and addressed.  No physical exam was performed (except for noted visual exam findings with Video Visits).   I connected with Dustin Mcintyre today at  1:00 PM EDT by telephone and verified that I am speaking with the correct person using two identifiers. Location patient: home Location provider: work or home office Persons participating in the virtual visit: patient, provider  I discussed the limitations, risks, security and privacy concerns of performing an evaluation and management service by telephone and the availability of in person appointments. I also discussed with the patient that there may be a patient responsible charge related to this service. The patient expressed understanding and agreed to proceed.  HPI:  Patient and I connected via telephone due to complaints of slight cough, congestion, body aches and general feeling of tiredness.  Patient states he has had some sinus congestion building for little over a week, but thought maybe it was seasonal allergies.  States over this weekend he began feeling very fatigued, achy all over and alternating between sweating and feeling cold.  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  States it is very possible he came into contact with somebody who has been sick with COVID-19 due to having to make stops along the way at different truck stops.  States when he blows his nose is a thick green and yellow color and also has a lot of sinus pain on right side of face.  ROS:  See pertinent positives and negatives per HPI.  Patient Active Problem List   Diagnosis Date Noted   Hyperlipidemia associated with type 2 diabetes mellitus ([place]) 04/23/2018   Elevated BP without diagnosis of hypertension 04/23/2018   Morbid obesity ([place]) 04/23/2018   Type 2 diabetes mellitus without complication, without long-term current use of insulin ([place]) 04/23/2018    Past Medical History:  Diagnosis Date   Diverticulitis    Hay fever     Family History  Problem Relation Age of Onset   Hypertension Father    Hypertension Paternal Uncle    Social History   Tobacco Use   Smoking status: Never Smoker   Smokeless tobacco: Current User    Types: Chew  Substance Use Topics   Alcohol use: Yes    Current Outpatient Medications:    amoxicillin-clavulanate (AUGMENTIN) 875-125 MG tablet, Take 1 tablet by mouth 2 (two) times daily., Disp: 20 tablet, Rfl: 0   Ascorbic Acid (VITAMIN C) 1000 MG tablet, Take 1,000 mg by mouth daily., Disp: , Rfl:    blood glucose meter kit and supplies, Dispense based on patient and insurance preference. Use up to four times daily as directed. (FOR ICD-10 E10.9, E11.9)., Disp: 1 each, Rfl: 0   Boswellia-Glucosamine-Vit D (OSTEO BI-FLEX ONE PER DAY PO), Take by mouth., Disp: , Rfl:    fexofenadine (ALLEGRA) 180 MG tablet, Take 180 mg by mouth daily., Disp: , Rfl:    Garlic 0459 MG CAPS, Take 1,000 mg by mouth., Disp: , Rfl:  glucose blood test strip, Use as instructed, Disp: 100 each, Rfl: 12   ibuprofen (ADVIL,MOTRIN) 800 MG tablet, Take 800 mg by mouth every 8 (eight) hours as needed., Disp: , Rfl:    Insulin Pen Needle (PEN NEEDLES 29GX1/2") 29G X 12MM MISC, Use daily with victoza, Disp: 100 each, Rfl: 2   liraglutide (VICTOZA) 18 MG/3ML SOPN, Inject 0.6 mg SubQ daily for 7 days, then increase dose to 1.2 mg SubQ daily, Disp: 5 pen, Rfl: 3   metFORMIN (GLUCOPHAGE) 1000 MG tablet, Take 1 tablet (1,000 mg total) by mouth 2  (two) times daily with a meal., Disp: 180 tablet, Rfl: 1   Multiple Vitamin (MULTIVITAMIN) tablet, Take 1 tablet by mouth daily., Disp: , Rfl:    Omega-3 Fatty Acids (FISH OIL) 1000 MG CAPS, Take 1,000 mg by mouth 2 (two) times daily., Disp: , Rfl:    omeprazole (PRILOSEC) 20 MG capsule, Take 20 mg by mouth daily., Disp: , Rfl:    ONE TOUCH ULTRA TEST test strip, USE TO CHECK SUGARS UP TO FOUR TIMES DAILY AS NEEDED, Disp: 100 each, Rfl: 1   sitaGLIPtin (JANUVIA) 100 MG tablet, Take 1 tablet (100 mg total) by mouth daily., Disp: 90 tablet, Rfl: 1  EXAM:  GENERAL: alert, oriented, sounds well and in no acute distress  LUNGS: Speaking in full sentences, no signs of respiratory distress, breathing rate appears normal, no obvious gross SOB, gasping or wheezing  PSYCH/NEURO: pleasant and cooperative, no obvious depression or anxiety, speech and thought processing grossly intact  ASSESSMENT AND PLAN:  Discussed the following assessment and plan:  Suspected COVID-19 virus infection, sinus issues, fever, chills, fatigue, aches, cough - advised that due to symptoms, we need to get patient set up for COVID-19 testing.  Patient advised that I will put order in and he can go to testing location for for drive-through testing. Patient given the address of testing site.  Patient advised that testing is taking 2 to 7 days to result, and while we are awaiting results patient must remain under self quarantine and monitor for any changing/worsening symptoms.  Advised over-the-counter medications such as Tylenol can be used to help treat pain or fevers, Robitussin can be used to help calm cough, allergy medication such as Claritin or Allegra can help reduce congestion.  Also discussed getting plenty of rest and increasing fluid intake.  Made patient aware that test results as well as how his symptoms progress will determine when the self quarantine will be able to end.  Also advised to monitor self for any  worsening symptoms, advised if severe shortness of breath develops, high fever that is not reduced with use of Tylenol, chest pain, severe vomiting or diarrhea  --patient must call on-call and or go to ER right away for evaluation. patient verbalized understanding of these instructions.  Sinusitis -- Due to thick nasal drainage and sinus congestion we will also cover patient for a sinusitis infection.  He will take Augmentin twice daily for 10 days.  Advised to try and do saline nasal rinses as well to help rinse out sinuses.  Work note sent to patient's email   I discussed the assessment and treatment plan with the patient. The patient was provided an opportunity to ask questions and all were answered. The patient agreed with the plan and demonstrated an understanding of the instructions.   The patient was advised to call back or seek an in-person evaluation if the symptoms worsen or if the condition fails to  improve as anticipated.  13 minutes spent on phone with patient for phone call visit.   Jodelle Green, FNP

## 2018-09-29 ENCOUNTER — Telehealth: Payer: Self-pay | Admitting: *Deleted

## 2018-09-29 ENCOUNTER — Encounter: Payer: Self-pay | Admitting: *Deleted

## 2018-09-29 LAB — NOVEL CORONAVIRUS, NAA: SARS-CoV-2, NAA: DETECTED — AB

## 2018-09-29 NOTE — Telephone Encounter (Signed)
Patient says he is feeling bette but not as many sweats and chills , continues to have intermittent fever, headaches are not as bad as in the beginning. Notified health Dept, sent quarantine information to patient and visitor log. Directions were also given verbally to  Self quarantine.

## 2018-09-29 NOTE — Telephone Encounter (Signed)
-----   Message from Jodelle Green, FNP sent at 09/29/2018  2:49 PM EDT ----- [facility] P[facility] - mr McGintys covid test is positive  He does not have MyChart so will need to be called  Thanks! LG

## 2018-09-30 NOTE — Telephone Encounter (Signed)
Thank you :)

## 2018-09-30 NOTE — Telephone Encounter (Signed)
Pt's wife called back wanting to know why the Health Dept called yesterday.  Pt's wife said that Health Department was asking personal questions such as "where the pt worked", "his race", ect.  Pt's wife said that she felt it was a violation of their privacy and pt told her to hang up the phone and he would contact their attorney for harrassment if the health dept called back.  Pt's wife said that she had seen on the news that the [place] should not be doing tracing.  Informed pt's wife that it was our protocol here to contact the Health Dept when a pt tests positive and directed her to contact the Health Dept with any concerns she has about types of questions and call received.   Pt's wife also said that pt had not received paper work that Juliann Pulse was sending.  Pt's wife thought that papers would be emailed.  Informed pt that note said that self quarantine information visitor log has been sent out which is usually by mail.  Pt's wife said she assumed information would be emailed so that they could receive the information quicker.    Pt's e-mail address is [email or web address] if information can be mailed.

## 2018-09-30 NOTE — Telephone Encounter (Signed)
See note patient wife chart.

## 2018-10-08 ENCOUNTER — Ambulatory Visit (INDEPENDENT_AMBULATORY_CARE_PROVIDER_SITE_OTHER): Payer: PRIVATE HEALTH INSURANCE | Admitting: Family Medicine

## 2018-10-08 ENCOUNTER — Other Ambulatory Visit: Payer: Self-pay

## 2018-10-08 DIAGNOSIS — U071 COVID-19: Secondary | ICD-10-CM

## 2018-10-08 DIAGNOSIS — J019 Acute sinusitis, unspecified: Secondary | ICD-10-CM | POA: Diagnosis not present

## 2018-10-08 NOTE — Progress Notes (Signed)
Patient ID: Dustin Mcintyre, male   DOB: 05/24/1965, 53 y.o.   MRN: [id number]    Virtual Visit via phone Note  This visit type was conducted due to national recommendations for restrictions regarding the COVID-19 pandemic (e.g. social distancing).  This format is felt to be most appropriate for this patient at this time.  All issues noted in this document were discussed and addressed.  No physical exam was performed (except for noted visual exam findings with Video Visits).   I connected with Dustin Mcintyre today at  1:40 PM EDT by telephone and verified that I am speaking with the correct person using two identifiers. Location patient: home Location provider: work or home office Persons participating in the virtual visit: patient, provider  I discussed the limitations, risks, security and privacy concerns of performing an evaluation and management service by telephone and the availability of in person appointments. I also discussed with the patient that there may be a patient responsible charge related to this service. The patient expressed understanding and agreed to proceed.   HPI:  Patient and I connected via telephone to follow-up on positive COVID-19 testing and how he is doing.  Patient and I first connected via telephone on 09/27/2018 he had already been sick for approximately 7 days at that point.  We suspect possible sinus infection and also possibly COVID-19 infection.  Patient got tested for COVID-19 and his results were positive.  Patient states he is now feeling much better.  It has been 3 days since he has had any cough, chills or fever.  Appetite is back to normal.  He is sleeping well.   ROS: See pertinent positives and negatives per HPI.  Past Medical History:  Diagnosis Date  . Diverticulitis   . Hay fever     No past surgical history on file.  Family History  Problem Relation Age of Onset  . Hypertension Father   . Hypertension Paternal Uncle     Social History    Tobacco Use  . Smoking status: Never Smoker  . Smokeless tobacco: Current User    Types: Chew  Substance Use Topics  . Alcohol use: Yes      Current Outpatient Medications:  .  amoxicillin-clavulanate (AUGMENTIN) 875-125 MG tablet, Take 1 tablet by mouth 2 (two) times daily., Disp: 20 tablet, Rfl: 0 .  Ascorbic Acid (VITAMIN C) 1000 MG tablet, Take 1,000 mg by mouth daily., Disp: , Rfl:  .  blood glucose meter kit and supplies, Dispense based on patient and insurance preference. Use up to four times daily as directed. (FOR ICD-10 E10.9, E11.9)., Disp: 1 each, Rfl: 0 .  Boswellia-Glucosamine-Vit D (OSTEO BI-FLEX ONE PER DAY PO), Take by mouth., Disp: , Rfl:  .  fexofenadine (ALLEGRA) 180 MG tablet, Take 180 mg by mouth daily., Disp: , Rfl:  .  Garlic 3419 MG CAPS, Take 1,000 mg by mouth., Disp: , Rfl:  .  glucose blood test strip, Use as instructed, Disp: 100 each, Rfl: 12 .  ibuprofen (ADVIL,MOTRIN) 800 MG tablet, Take 800 mg by mouth every 8 (eight) hours as needed., Disp: , Rfl:  .  Insulin Pen Needle (PEN NEEDLES 29GX1/2") 29G X 12MM MISC, Use daily with victoza, Disp: 100 each, Rfl: 2 .  liraglutide (VICTOZA) 18 MG/3ML SOPN, Inject 0.6 mg SubQ daily for 7 days, then increase dose to 1.2 mg SubQ daily, Disp: 5 pen, Rfl: 3 .  metFORMIN (GLUCOPHAGE) 1000 MG tablet, Take 1 tablet (1,000 mg total) by  mouth 2 (two) times daily with a meal., Disp: 180 tablet, Rfl: 1 .  Multiple Vitamin (MULTIVITAMIN) tablet, Take 1 tablet by mouth daily., Disp: , Rfl:  .  Omega-3 Fatty Acids (FISH OIL) 1000 MG CAPS, Take 1,000 mg by mouth 2 (two) times daily., Disp: , Rfl:  .  omeprazole (PRILOSEC) 20 MG capsule, Take 20 mg by mouth daily., Disp: , Rfl:  .  ONE TOUCH ULTRA TEST test strip, USE TO CHECK SUGARS UP TO FOUR TIMES DAILY AS NEEDED, Disp: 100 each, Rfl: 1 .  sitaGLIPtin (JANUVIA) 100 MG tablet, Take 1 tablet (100 mg total) by mouth daily., Disp: 90 tablet, Rfl: 1  EXAM:  GENERAL: alert,  oriented, sounds well and in no acute distress  LUNGS: Speaking in full sentences, no signs of respiratory distress, breathing rate appears normal, no obvious gross SOB, gasping or wheezing  PSYCH/NEURO: pleasant and cooperative, no obvious depression or anxiety, speech and thought processing grossly intact  ASSESSMENT AND PLAN:  Discussed the following assessment and plan:  COVID-19 infection, sinusitis- patient has been symptom-free for 3 days without any assistance from Tylenol or ibuprofen.  He will be released to return back to work on his normal work schedule with first shifts on 10/12/2018.  Discussed strategies to keep himself safe including good handwashing, wearing a mask and avoiding large crowds.    I discussed the assessment and treatment plan with the patient. The patient was provided an opportunity to ask questions and all were answered. The patient agreed with the plan and demonstrated an understanding of the instructions.   The patient was advised to call back or seek an in-person evaluation if the symptoms worsen or if the condition fails to improve as anticipated.  I provided 15 minutes of non-face-to-face time during this encounter.   Jodelle Green, FNP

## 2018-10-18 ENCOUNTER — Other Ambulatory Visit: Payer: Self-pay

## 2018-10-18 ENCOUNTER — Other Ambulatory Visit: Payer: Self-pay | Admitting: Family Medicine

## 2018-10-18 ENCOUNTER — Telehealth: Payer: Self-pay | Admitting: *Deleted

## 2018-10-18 ENCOUNTER — Ambulatory Visit (INDEPENDENT_AMBULATORY_CARE_PROVIDER_SITE_OTHER): Payer: PRIVATE HEALTH INSURANCE | Admitting: Family Medicine

## 2018-10-18 DIAGNOSIS — J019 Acute sinusitis, unspecified: Secondary | ICD-10-CM

## 2018-10-18 DIAGNOSIS — Z20822 Contact with and (suspected) exposure to covid-19: Secondary | ICD-10-CM

## 2018-10-18 MED ORDER — DOXYCYCLINE HYCLATE 100 MG PO TABS: 100.0000 mg | ORAL_TABLET | Freq: Two times a day (BID) | ORAL | 0 refills | Status: DC

## 2018-10-18 NOTE — Progress Notes (Signed)
Patient ID: Dustin Mcintyre, male   DOB: Jul 17, 1965, 53 y.o.   MRN: [id number]     Virtual Visit via video Note  This visit type was conducted due to national recommendations for restrictions regarding the COVID-19 pandemic (e.g. social distancing).  This format is felt to be most appropriate for this patient at this time.  All issues noted in this document were discussed and addressed.  No physical exam was performed (except for noted visual exam findings with Video Visits).   I connected with Dustin Mcintyre today at 11:20 AM EDT by a video enabled telemedicine application or telephone and verified that I am speaking with the correct person using two identifiers. Location patient: home Location provider: work or home office Persons participating in the virtual visit: patient, provider  I discussed the limitations, risks, security and privacy concerns of performing an evaluation and management service by video and the availability of in person appointments. I also discussed with the patient that there may be a patient responsible charge related to this service. The patient expressed understanding and agreed to proceed.   HPI:  Patient and I connected via video to discuss sinus congestion symptoms and drainage down back of throat.  Patient had similar symptoms develop around 09/20/2018 and they persisted for about a week, at that point he also began to develop body aches, fever and chills so we did tested for coronavirus.  COVID-19 test was positive.  Over the next 10 days he began to feel improved and all of his symptoms resolved.  He was released back to work and was feeling well.  Started to develop sinus congestion, drainage down back of throat and sensation of always having to clear her throat 2 days ago.  He has been taking his allergy medication and using nasal spray however is concerned he potentially is having a recurrence of sinus infection.  Patient has been back to work, he has been  taking all proper precautions including wearing glasses, wearing a mask and doing diligent handwashing.  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  Denies any fever or chills.  Denies shortness of breath or wheezing.  Denies cough.  Denies chest pain.  Denies nausea, vomiting or diarrhea.  ROS: See pertinent positives and negatives per HPI.  Past Medical History:  Diagnosis Date  . Diverticulitis   . Hay fever     No past surgical history on file.  Family History  Problem Relation Age of Onset  . Hypertension Father   . Hypertension Paternal Uncle     Social History   Tobacco Use  . Smoking status: Never Smoker  . Smokeless tobacco: Current User    Types: Chew  Substance Use Topics  . Alcohol use: Yes    Current Outpatient Medications:  .  Ascorbic Acid (VITAMIN C) 1000 MG tablet, Take 1,000 mg by mouth daily., Disp: , Rfl:  .  blood glucose meter kit and supplies, Dispense based on patient and insurance preference. Use up to four times daily as directed. (FOR ICD-10 E10.9, E11.9)., Disp: 1 each, Rfl: 0 .  Boswellia-Glucosamine-Vit D (OSTEO BI-FLEX ONE PER DAY PO), Take by mouth., Disp: , Rfl:  .  fexofenadine (ALLEGRA) 180 MG tablet, Take 180 mg by mouth daily., Disp: , Rfl:  .  Garlic 3335 MG CAPS, Take 1,000 mg by mouth., Disp: , Rfl:  .  glucose blood test strip, Use as instructed, Disp: 100 each, Rfl:  12 .  ibuprofen (ADVIL,MOTRIN) 800 MG tablet, Take 800 mg by mouth every 8 (eight) hours as needed., Disp: , Rfl:  .  Insulin Pen Needle (PEN NEEDLES 29GX1/2") 29G X 12MM MISC, Use daily with victoza, Disp: 100 each, Rfl: 2 .  liraglutide (VICTOZA) 18 MG/3ML SOPN, Inject 0.6 mg SubQ daily for 7 days, then increase dose to 1.2 mg SubQ daily, Disp: 5 pen, Rfl: 3 .  metFORMIN (GLUCOPHAGE) 1000 MG tablet, Take 1 tablet (1,000 mg total) by mouth 2 (two) times daily with a meal., Disp: 180 tablet, Rfl: 1 .  Multiple Vitamin  (MULTIVITAMIN) tablet, Take 1 tablet by mouth daily., Disp: , Rfl:  .  Omega-3 Fatty Acids (FISH OIL) 1000 MG CAPS, Take 1,000 mg by mouth 2 (two) times daily., Disp: , Rfl:  .  omeprazole (PRILOSEC) 20 MG capsule, Take 20 mg by mouth daily., Disp: , Rfl:  .  ONE TOUCH ULTRA TEST test strip, USE TO CHECK SUGARS UP TO FOUR TIMES DAILY AS NEEDED, Disp: 100 each, Rfl: 1 .  sitaGLIPtin (JANUVIA) 100 MG tablet, Take 1 tablet (100 mg total) by mouth daily., Disp: 90 tablet, Rfl: 1 .  doxycycline (VIBRA-TABS) 100 MG tablet, Take 1 tablet (100 mg total) by mouth 2 (two) times daily., Disp: 20 tablet, Rfl: 0  EXAM:  GENERAL: alert, oriented, appears well and in no acute distress  HEENT: atraumatic, conjunttiva clear, no obvious abnormalities on inspection of external nose and ears  NECK: normal movements of the head and neck  LUNGS: on inspection no signs of respiratory distress, breathing rate appears normal, no obvious gross SOB, gasping or wheezing  CV: no obvious cyanosis  MS: moves all visible extremities without noticeable abnormality  PSYCH/NEURO: pleasant and cooperative, no obvious depression or anxiety, speech and thought processing grossly intact  ASSESSMENT AND PLAN:  Discussed the following assessment and plan:  Sinusitis - due to patient's symptoms of thick nasal congestion and drainage in back of throat I do think it is reasonable to treat him with antibiotic to cover sinus infection.  He will continue using nasal spray and daily oral allergy antihistamine to help combat the congestion symptoms as well.  Suspected COVID-19 virus infection (re-infection) - advised that due to symptoms, we need to get patient set up for COVID-19 testing.  Patient advised that I will put order in and he can go to testing location for for drive-through testing. Patient given the address of testing site.  Patient again advised that testing is taking 2 to 7 days to result, and while we are awaiting  results patient must remain under self quarantine and monitor for any changing/worsening symptoms.  Advised patient that it is possible to be reinfected with COVID-19 and this is why we should retest him.  Advised over-the-counter medications such as Tylenol can be used to help treat pain or fevers, Robitussin can be used to help calm cough, allergy medication such as Claritin or Allegra can help reduce congestion.  Also discussed getting plenty of rest and increasing fluid intake.  Made patient aware that test results as well as how his symptoms progress will determine when the self quarantine will be able to end.  Also advised to monitor self for any worsening symptoms, advised if severe shortness of breath develops, high fever that is not reduced with use of Tylenol, chest pain, severe vomiting or diarrhea  --patient must call on-call and or go to ER right away for evaluation. patient verbalized understanding of these  instructions.   Work note released to his MyChart   I discussed the assessment and treatment plan with the patient. The patient was provided an opportunity to ask questions and all were answered. The patient agreed with the plan and demonstrated an understanding of the instructions.   The patient was advised to call back or seek an in-person evaluation if the symptoms worsen or if the condition fails to improve as anticipated.  Jodelle Green, FNP

## 2018-10-18 NOTE — Telephone Encounter (Signed)
Copied from [facility] [id number]. Topic: Appointment Scheduling - Scheduling Inquiry for Clinic >> Oct 18, 2018  9:13 AM Rainey Pines A wrote:  Patient requesting appt for today or tomorrow for sinus infection,stuffy nose,sore throat, and cough.

## 2018-10-19 ENCOUNTER — Telehealth: Payer: Self-pay | Admitting: Family Medicine

## 2018-10-19 LAB — NOVEL CORONAVIRUS, NAA: SARS-CoV-2, NAA: NOT DETECTED

## 2018-10-19 NOTE — Telephone Encounter (Signed)
Letter sent to Pt email

## 2018-10-19 NOTE — Telephone Encounter (Signed)
Pt wife called and stated that she would like some one to email a copy of the letter for being out due to covid to [email or web address]. pt wife states that the letter also has to be signed. Please advise

## 2018-10-19 NOTE — Telephone Encounter (Signed)
Letter can be printed off and I can sign. Then can be emailed

## 2018-10-19 NOTE — Telephone Encounter (Signed)
Pt wife called [facility] Pt wife called and stated that she would like some one to email a copy of the letter for being out due to covid to [email or web address]. pt wife states that the letter also has to be signed. Please advise

## 2018-10-22 ENCOUNTER — Other Ambulatory Visit: Payer: Self-pay

## 2018-10-22 ENCOUNTER — Ambulatory Visit (INDEPENDENT_AMBULATORY_CARE_PROVIDER_SITE_OTHER): Payer: PRIVATE HEALTH INSURANCE | Admitting: Family Medicine

## 2018-10-22 DIAGNOSIS — R52 Pain, unspecified: Secondary | ICD-10-CM

## 2018-10-22 DIAGNOSIS — J019 Acute sinusitis, unspecified: Secondary | ICD-10-CM

## 2018-10-22 DIAGNOSIS — E119 Type 2 diabetes mellitus without complications: Secondary | ICD-10-CM

## 2018-10-22 DIAGNOSIS — R509 Fever, unspecified: Secondary | ICD-10-CM

## 2018-10-22 NOTE — Progress Notes (Signed)
Patient ID: Dustin Mcintyre, male   DOB: [birth year], 53 y.o.   MRN: [id number]    Virtual Visit via video Note  This visit type was conducted due to national recommendations for restrictions regarding the COVID-19 pandemic (e.g. social distancing).  This format is felt to be most appropriate for this patient at this time.  All issues noted in this document were discussed and addressed.  No physical exam was performed (except for noted visual exam findings with Video Visits).   I connected with Dustin Mcintyre today at  8:00 AM EDT by a video enabled telemedicine application or telephone and verified that I am speaking with the correct person using two identifiers. Location patient: home Location provider: work or home office Persons participating in the virtual visit: patient, provider  I discussed the limitations, risks, security and privacy concerns of performing an evaluation and management service by video and the availability of in person appointments. I also discussed with the patient that there may be a patient responsible charge related to this service. The patient expressed understanding and agreed to proceed.   HPI:  Patient and I connected via video to follow-up on sinusitis infection and how he is doing since getting it negative COVID-19 testing results after he was tested on 10/18/2018.  Patient states earlier this week he was feeling achy, alternating between some fever and chills and his sinuses are very congested with some cough.  States for the past 24 hours he has started to see a big turnaround and feel very well.  Denies shortness of breath or wheezing.  Denies chest pain.  Feels the sinuses are finally clearing up.  Appetite is back to normal.  He has been keeping up good water intake.  Also reports with being sick, his blood sugars have been slightly higher than where he wants them to be ranging from 1 50-1 70, prior to being sick he was staying mainly in the 110-130 range.   Denies any hypo-or hyperglycemia.    ROS: See pertinent positives and negatives per HPI.  Past Medical History:  Diagnosis Date  . Diverticulitis   . Hay fever     No past surgical history on file.  Family History  Problem Relation Age of Onset  . Hypertension Father   . Hypertension Paternal Uncle    Social History   Tobacco Use  . Smoking status: Never Smoker  . Smokeless tobacco: Current User    Types: Chew  Substance Use Topics  . Alcohol use: Yes     Current Outpatient Medications:  .  Ascorbic Acid (VITAMIN C) 1000 MG tablet, Take 1,000 mg by mouth daily., Disp: , Rfl:  .  blood glucose meter kit and supplies, Dispense based on patient and insurance preference. Use up to four times daily as directed. (FOR ICD-10 E10.9, E11.9)., Disp: 1 each, Rfl: 0 .  Boswellia-Glucosamine-Vit D (OSTEO BI-FLEX ONE PER DAY PO), Take by mouth., Disp: , Rfl:  .  doxycycline (VIBRA-TABS) 100 MG tablet, Take 1 tablet (100 mg total) by mouth 2 (two) times daily., Disp: 20 tablet, Rfl: 0 .  fexofenadine (ALLEGRA) 180 MG tablet, Take 180 mg by mouth daily., Disp: , Rfl:  .  Garlic 4742 MG CAPS, Take 1,000 mg by mouth., Disp: , Rfl:  .  glucose blood test strip, Use as instructed, Disp: 100 each, Rfl: 12 .  ibuprofen (ADVIL,MOTRIN) 800 MG tablet, Take 800 mg by mouth every 8 (eight) hours as needed., Disp: , Rfl:  .  Insulin Pen Needle (PEN NEEDLES 29GX1/2") 29G X 12MM MISC, Use daily with victoza, Disp: 100 each, Rfl: 2 .  liraglutide (VICTOZA) 18 MG/3ML SOPN, Inject 0.6 mg SubQ daily for 7 days, then increase dose to 1.2 mg SubQ daily, Disp: 5 pen, Rfl: 3 .  metFORMIN (GLUCOPHAGE) 1000 MG tablet, Take 1 tablet (1,000 mg total) by mouth 2 (two) times daily with a meal., Disp: 180 tablet, Rfl: 1 .  Multiple Vitamin (MULTIVITAMIN) tablet, Take 1 tablet by mouth daily., Disp: , Rfl:  .  Omega-3 Fatty Acids (FISH OIL) 1000 MG CAPS, Take 1,000 mg by mouth 2 (two) times daily., Disp: , Rfl:  .   omeprazole (PRILOSEC) 20 MG capsule, Take 20 mg by mouth daily., Disp: , Rfl:  .  ONE TOUCH ULTRA TEST test strip, USE TO CHECK SUGARS UP TO FOUR TIMES DAILY AS NEEDED, Disp: 100 each, Rfl: 1 .  sitaGLIPtin (JANUVIA) 100 MG tablet, Take 1 tablet (100 mg total) by mouth daily., Disp: 90 tablet, Rfl: 1  EXAM:  GENERAL: alert, oriented, appears well and in no acute distress  HEENT: atraumatic, conjunttiva clear, no obvious abnormalities on inspection of external nose and ears  NECK: normal movements of the head and neck  LUNGS: on inspection no signs of respiratory distress, breathing rate appears normal, no obvious gross SOB, gasping or wheezing  CV: no obvious cyanosis  MS: moves all visible extremities without noticeable abnormality  PSYCH/NEURO: pleasant and cooperative, no obvious depression or anxiety, speech and thought processing grossly intact  ASSESSMENT AND PLAN:  Discussed the following assessment and plan:  Sinusitis, body aches, fever and chills -- patient symptoms are improving.  He will finish antibiotic course for sinus infection as prescribed.  He will continue good fluid intake and to use nasal saline rinses to help reduce sinus congestion.  Been fever free for it 72 hours before going back to work; so advised if symptoms worsen or change or over the weekend he must let us know.  If he continues to feel better and remains fever free over the weekend he can return to work on Tuesday 8/25  Type 2 diabetes - patient commended for his great blood sugar numbers overall.  Advised that during his sickness, it is not uncommon for her blood sugars to be slightly elevated.  Overall his blood sugars appear to be under great control.   I discussed the assessment and treatment plan with the patient. The patient was provided an opportunity to ask questions and all were answered. The patient agreed with the plan and demonstrated an understanding of the instructions.   The patient was  advised to call back or seek an in-person evaluation if the symptoms do not improve as expected.    Jodelle Green, FNP

## 2018-11-03 ENCOUNTER — Other Ambulatory Visit: Payer: Self-pay | Admitting: Family Medicine

## 2018-11-03 DIAGNOSIS — E119 Type 2 diabetes mellitus without complications: Secondary | ICD-10-CM

## 2019-01-07 ENCOUNTER — Telehealth: Payer: Self-pay | Admitting: Family Medicine

## 2019-01-07 DIAGNOSIS — E119 Type 2 diabetes mellitus without complications: Secondary | ICD-10-CM

## 2019-01-07 MED ORDER — GLUCOSE BLOOD VI STRP: ORAL_STRIP | 4 refills | Status: DC

## 2019-01-07 MED ORDER — SITAGLIPTIN PHOSPHATE 100 MG PO TABS: ORAL_TABLET | ORAL | 1 refills | Status: DC

## 2019-01-07 NOTE — Telephone Encounter (Signed)
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

## 2019-01-07 NOTE — Telephone Encounter (Signed)
Script sent  

## 2019-01-10 MED ORDER — METFORMIN HCL 1000 MG PO TABS: 1000.0000 mg | ORAL_TABLET | Freq: Two times a day (BID) | ORAL | 1 refills | Status: DC

## 2019-01-10 NOTE — Addendum Note (Signed)
Addended by: Nanci Pina on: 01/10/2019 05:08 PM   Modules accepted: Orders

## 2019-01-10 NOTE — Telephone Encounter (Signed)
Patient wife called stating pharmacy does not have any medication. Call back [phone] Please call back patients wife when we resent to pharmacy

## 2019-01-11 ENCOUNTER — Other Ambulatory Visit: Payer: Self-pay

## 2019-01-11 DIAGNOSIS — E119 Type 2 diabetes mellitus without complications: Secondary | ICD-10-CM

## 2019-01-11 MED ORDER — GLUCOSE BLOOD VI STRP: ORAL_STRIP | 12 refills | Status: AC

## 2019-01-11 MED ORDER — METFORMIN HCL 1000 MG PO TABS: 1000.0000 mg | ORAL_TABLET | Freq: Two times a day (BID) | ORAL | 1 refills | Status: DC

## 2019-01-11 NOTE — Telephone Encounter (Signed)
Resent prescriptions.

## 2019-03-22 ENCOUNTER — Telehealth: Payer: Self-pay | Admitting: Family Medicine

## 2019-03-22 DIAGNOSIS — E119 Type 2 diabetes mellitus without complications: Secondary | ICD-10-CM

## 2019-03-22 MED ORDER — SITAGLIPTIN PHOSPHATE 100 MG PO TABS: ORAL_TABLET | ORAL | 1 refills | Status: DC

## 2019-03-22 NOTE — Telephone Encounter (Signed)
Insurance in chart has expired call ed and advised patient I will need new insurance to do the prior authorization.

## 2019-03-22 NOTE — Telephone Encounter (Signed)
Opened under wrong provider

## 2019-03-22 NOTE — Telephone Encounter (Signed)
Patient needs new script for Januvia and medication requires a PA .  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

## 2019-03-22 NOTE — Telephone Encounter (Signed)
That is fine 

## 2019-03-25 NOTE — Telephone Encounter (Signed)
Called patient plan and found out he has a discount plan for Medications not actual drug coverage and that they do not have a discount with Januvia. Advised patient to contact his employer to check status of drug coverage and he can go online to [place] and apply for their discount  Card since he has no insurance for drug coverage at this time. Patient took down numbers and name of site and stated he would call for discount card. Patient insurance ID the same RX [id number] BIN [id number] pin [id number].( [id number]) member ID.

## 2019-03-25 NOTE — Telephone Encounter (Signed)
Called back and attained information on card due to no copy on file.

## 2019-04-25 ENCOUNTER — Encounter: Payer: PRIVATE HEALTH INSURANCE | Admitting: Family

## 2019-05-06 NOTE — Progress Notes (Signed)
This encounter was created in error - please disregard.

## 2019-07-11 DIAGNOSIS — R05 Cough: Secondary | ICD-10-CM | POA: Diagnosis not present

## 2019-07-11 DIAGNOSIS — R197 Diarrhea, unspecified: Secondary | ICD-10-CM | POA: Diagnosis not present

## 2019-07-11 DIAGNOSIS — Z79899 Other long term (current) drug therapy: Secondary | ICD-10-CM | POA: Diagnosis not present

## 2019-07-11 DIAGNOSIS — F17220 Nicotine dependence, chewing tobacco, uncomplicated: Secondary | ICD-10-CM | POA: Insufficient documentation

## 2019-07-11 DIAGNOSIS — Z794 Long term (current) use of insulin: Secondary | ICD-10-CM | POA: Insufficient documentation

## 2019-07-11 DIAGNOSIS — J069 Acute upper respiratory infection, unspecified: Secondary | ICD-10-CM | POA: Insufficient documentation

## 2019-07-11 DIAGNOSIS — E119 Type 2 diabetes mellitus without complications: Secondary | ICD-10-CM | POA: Insufficient documentation

## 2019-07-11 DIAGNOSIS — J029 Acute pharyngitis, unspecified: Secondary | ICD-10-CM | POA: Insufficient documentation

## 2019-07-11 DIAGNOSIS — R0981 Nasal congestion: Secondary | ICD-10-CM | POA: Diagnosis present

## 2019-07-11 DIAGNOSIS — J0100 Acute maxillary sinusitis, unspecified: Secondary | ICD-10-CM | POA: Insufficient documentation

## 2019-07-12 ENCOUNTER — Encounter: Payer: Self-pay | Admitting: *Deleted

## 2019-07-12 ENCOUNTER — Emergency Department
Admission: EM | Admit: 2019-07-12 | Discharge: 2019-07-12 | Disposition: A | Discharge status: Home / Self Care | Payer: No Typology Code available for payment source | Attending: Emergency Medicine | Admitting: Emergency Medicine

## 2019-07-12 ENCOUNTER — Emergency Department: Payer: No Typology Code available for payment source

## 2019-07-12 ENCOUNTER — Other Ambulatory Visit: Payer: Self-pay

## 2019-07-12 DIAGNOSIS — J0100 Acute maxillary sinusitis, unspecified: Secondary | ICD-10-CM

## 2019-07-12 DIAGNOSIS — R197 Diarrhea, unspecified: Secondary | ICD-10-CM

## 2019-07-12 DIAGNOSIS — J069 Acute upper respiratory infection, unspecified: Secondary | ICD-10-CM

## 2019-07-12 LAB — CBC
HCT: 38.8 % — ABNORMAL LOW (ref 39.0–52.0)
Hemoglobin: 12.8 g/dL — ABNORMAL LOW (ref 13.0–17.0)
MCH: 29.9 pg (ref 26.0–34.0)
MCHC: 33 g/dL (ref 30.0–36.0)
MCV: 90.7 fL (ref 80.0–100.0)
Platelets: 294 10*3/uL (ref 150–400)
RBC: 4.28 MIL/uL (ref 4.22–5.81)
RDW: 13.2 % (ref 11.5–15.5)
WBC: 10.6 10*3/uL — ABNORMAL HIGH (ref 4.0–10.5)
nRBC: 0 % (ref 0.0–0.2)

## 2019-07-12 LAB — COMPREHENSIVE METABOLIC PANEL
ALT: 19 U/L (ref 0–44)
AST: 20 U/L (ref 15–41)
Albumin: 3.7 g/dL (ref 3.5–5.0)
Alkaline Phosphatase: 95 U/L (ref 38–126)
Anion gap: 7 (ref 5–15)
BUN: 12 mg/dL (ref 6–20)
CO2: 25 mmol/L (ref 22–32)
Calcium: 8.4 mg/dL — ABNORMAL LOW (ref 8.9–10.3)
Chloride: 109 mmol/L (ref 98–111)
Creatinine, Ser: 0.75 mg/dL (ref 0.61–1.24)
GFR calc Af Amer: >60 mL/min (ref >60)
GFR calc non Af Amer: >60 mL/min (ref >60)
Glucose, Bld: 141 mg/dL — ABNORMAL HIGH (ref 70–99)
Potassium: 3.6 mmol/L (ref 3.5–5.1)
Sodium: 141 mmol/L (ref 135–145)
Total Bilirubin: 0.5 mg/dL (ref 0.3–1.2)
Total Protein: 6.7 g/dL (ref 6.5–8.1)

## 2019-07-12 LAB — URINALYSIS, COMPLETE (UACMP) WITH MICROSCOPIC
Bacteria, UA: NONE SEEN
Bilirubin Urine: NEGATIVE
Glucose, UA: NEGATIVE mg/dL
Hgb urine dipstick: NEGATIVE
Ketones, ur: NEGATIVE mg/dL
Leukocytes,Ua: NEGATIVE
Nitrite: NEGATIVE
Protein, ur: NEGATIVE mg/dL
Specific Gravity, Urine: 1.026 (ref 1.005–1.030)
pH: 6 (ref 5.0–8.0)

## 2019-07-12 LAB — LIPASE, BLOOD: Lipase: 29 U/L (ref 11–51)

## 2019-07-12 MED ORDER — SULFAMETHOXAZOLE-TRIMETHOPRIM 800-160 MG PO TABS
1.0000 | ORAL_TABLET | Freq: Two times a day (BID) | ORAL | 0 refills | Status: DC
Start: 2019-07-12 — End: 2019-07-25

## 2019-07-12 MED ORDER — SODIUM CHLORIDE 0.9% FLUSH: 3.0000 mL | Freq: Once | INTRAVENOUS | Status: DC

## 2019-07-12 NOTE — ED Notes (Signed)
See triage note.  Presents with nasal congestion and cough  Also had diarrhea and sore throat   Unsure of fever  And is currently afebrile on arrival

## 2019-07-12 NOTE — ED Triage Notes (Signed)
Pt reports nasal congestion, diarrhea and sore throat  Sx for 2 weeks.  Pt also reports nose bleed from right nares earlier today, no bleeding now.  No abd pain.  Diarrhea x 3 today.  Pt alert  Speech clear.

## 2019-07-12 NOTE — Discharge Instructions (Signed)
Follow-up with your primary care provider if any continued problems.  Begin taking the antibiotic as directed and continue using the Claritin for allergies.  Increase fluids.  You may also use your Nettie pot or saline nose spray for nasal congestion.  Take Tylenol or ibuprofen as needed for sinus/dental pain.

## 2019-07-12 NOTE — ED Provider Notes (Signed)
[facility] Emergency Department Provider Note  ____________________________________________   First MD Initiated Contact with Patient 07/12/19 0740     (approximate)  I have reviewed the triage vital signs and the nursing notes.   HISTORY  Chief Complaint Nasal Congestion and Diarrhea   HPI Dustin Mcintyre is a 54 y.o. male presents to the ED with complaint of nasal congestion, sore throat with some cough for 2 weeks.  Patient is unaware of any fever and denies chills.  He reports that he had some bleeding from the right naris earlier this morning.  He also has had diarrhea off and known without any abdominal pain.  Patient has had diarrhea x3 today.  Patient reports that he is on Metformin and he runs bouts of constipation due to the medication and then has diarrhea.  He also travels from [place] to [place] several times a month driving.  He reports that he has already had the COVID-19 virus.  Positive history of sinusitis.  Currently taking Claritin for allergies.         Past Medical History:  Diagnosis Date  . Diverticulitis   . Hay fever     Patient Active Problem List   Diagnosis Date Noted  . Hyperlipidemia associated with type 2 diabetes mellitus ([place]) 04/23/2018  . Elevated BP without diagnosis of hypertension 04/23/2018  . Morbid obesity ([place]) 04/23/2018  . Type 2 diabetes mellitus without complication, without long-term current use of insulin ([place]) 04/23/2018    No past surgical history on file.  Prior to Admission medications   Medication Sig Start Date End Date Taking? Authorizing Provider  Ascorbic Acid (VITAMIN C) 1000 MG tablet Take 1,000 mg by mouth daily.    [provider]  blood glucose meter kit and supplies Dispense based on patient and insurance preference. Use up to four times daily as directed. (FOR ICD-10 E10.9, E11.9). 04/23/18   Guse, Jacquelynn Cree, FNP  Boswellia-Glucosamine-Vit D (OSTEO BI-FLEX ONE PER  DAY PO) Take by mouth.    [provider]  fexofenadine (ALLEGRA) 180 MG tablet Take 180 mg by mouth daily.    [provider]  Garlic 2536 MG CAPS Take 1,000 mg by mouth.    [provider]  glucose blood test strip Used to check blood sugars four times daily. 01/11/19   Guse, Jacquelynn Cree, FNP  ibuprofen (ADVIL,MOTRIN) 800 MG tablet Take 800 mg by mouth every 8 (eight) hours as needed.    [provider]  Insulin Pen Needle (PEN NEEDLES 29GX1/2") 29G X 12MM MISC Use daily with victoza 05/04/18   Guse, Jacquelynn Cree, FNP  liraglutide (VICTOZA) 18 MG/3ML SOPN Inject 0.6 mg SubQ daily for 7 days, then increase dose to 1.2 mg SubQ daily 05/04/18   Jodelle Green, FNP  metFORMIN (GLUCOPHAGE) 1000 MG tablet Take 1 tablet (1,000 mg total) by mouth 2 (two) times daily with a meal. 01/11/19   Guse, Jacquelynn Cree, FNP  Multiple Vitamin (MULTIVITAMIN) tablet Take 1 tablet by mouth daily.    [provider]  Omega-3 Fatty Acids (FISH OIL) 1000 MG CAPS Take 1,000 mg by mouth 2 (two) times daily.    [provider]  omeprazole (PRILOSEC) 20 MG capsule Take 20 mg by mouth daily.    [provider]  sitaGLIPtin (JANUVIA) 100 MG tablet TAKE 1 TABLET(100 MG) BY MOUTH DAILY 03/22/19   Leone Haven, MD  sulfamethoxazole-trimethoprim (BACTRIM DS) 800-160 MG tablet Take 1 tablet by mouth 2 (  two) times daily. 07/12/19   Johnn Hai, PA-C    Allergies Patient has no known allergies.  Family History  Problem Relation Age of Onset  . Hypertension Father   . Hypertension Paternal Uncle     Social History Social History   Tobacco Use  . Smoking status: Never Smoker  . Smokeless tobacco: Current User    Types: Chew  Substance Use Topics  . Alcohol use: Yes  . Drug use: Never    Review of Systems Constitutional: No fever/chills Eyes: No visual changes. ENT: Positive sore throat, positive nasal congestion.  Positive teeth hurting. Cardiovascular:  Denies chest pain. Respiratory: Denies shortness of breath.  Positive cough. Gastrointestinal: No abdominal pain.  No nausea, no vomiting.  Positive diarrhea.  No constipation. Genitourinary: Negative for dysuria. Musculoskeletal: Negative for muscle aches. Skin: Negative for rash. Neurological: Negative for headaches, focal weakness or numbness. ____________________________________________   PHYSICAL EXAM:  VITAL SIGNS: ED Triage Vitals  Enc Vitals Group     BP 07/12/19 0018 (!) 179/88     Pulse Rate 07/12/19 0018 80     Resp 07/12/19 0018 20     Temp 07/12/19 0018 97.9 F (36.6 C)     Temp Source 07/12/19 0018 Oral     SpO2 07/12/19 0018 96 %     Weight 07/12/19 0019 (!) 350 lb (158.8 kg)     Height 07/12/19 0019 5' 8"  (1.727 m)     Head Circumference --      Peak Flow --      Pain Score 07/12/19 0019 0     Pain Loc --      Pain Edu? --      Excl. in [place]? --    Constitutional: Alert and oriented. Well appearing and in no acute distress. Eyes: Conjunctivae are normal. PERRL. EOMI. Head: Atraumatic. Nose: Mild congestion/rhinnorhea.  EACs and TMs are clear bilaterally. Mouth/Throat: Mucous membranes are moist.  Oropharynx non-erythematous.  Posterior drainage present.  Uvula is midline.  Nontender sinuses to percussion however patient does complain of dental pain after percussion. Neck: No stridor.   Hematological/Lymphatic/Immunilogical: No cervical lymphadenopathy. Cardiovascular: Normal rate, regular rhythm. Grossly normal heart sounds.  Good peripheral circulation. Respiratory: Normal respiratory effort.  No retractions. Lungs CTAB. Gastrointestinal: Soft and nontender. No distention.  Bowel sounds are hyperactive at present. Musculoskeletal: Moves upper and lower extremities with any difficulty.  Normal gait was noted. Neurologic:  Normal speech and language. No gross focal neurologic deficits are appreciated. No gait instability. Skin:  Skin is warm, dry and intact. No  rash noted. Psychiatric: Mood and affect are normal. Speech and behavior are normal.  ____________________________________________   LABS (all labs ordered are listed, but only abnormal results are displayed)  Labs Reviewed  COMPREHENSIVE METABOLIC PANEL - Abnormal; Notable for the following components:      Result Value   Glucose, Bld 141 (*)    Calcium 8.4 (*)    All other components within normal limits  CBC - Abnormal; Notable for the following components:   WBC 10.6 (*)    Hemoglobin 12.8 (*)    HCT 38.8 (*)    All other components within normal limits  URINALYSIS, COMPLETE (UACMP) WITH MICROSCOPIC - Abnormal; Notable for the following components:   Color, Urine YELLOW (*)    APPearance CLEAR (*)    All other components within normal limits  LIPASE, BLOOD    RADIOLOGY   Official radiology report(s): DG Chest Portable 1 View  Result Date: 07/12/2019 CLINICAL DATA:  Cough, sore throat EXAM: PORTABLE CHEST 1 VIEW COMPARISON:  None. FINDINGS: The heart size and mediastinal contours are within normal limits. No new consolidation or edema. No pleural effusion. The visualized skeletal structures are unremarkable. IMPRESSION: No acute process in the chest. Electronically Signed   By: Macy Mis M.D.   On: 07/12/2019 08:55    ____________________________________________   PROCEDURES  Procedure(s) performed (including Critical Care):  Procedures   ____________________________________________   INITIAL IMPRESSION / ASSESSMENT AND PLAN / ED COURSE  As part of my medical decision making, I reviewed the following data within the electronic MEDICAL RECORD NUMBER Notes from prior ED visits and [place] Controlled Substance Database  54 year old male presents to the ED with complaint of upper respiratory symptoms along with sore throat for 2 weeks.  Patient has also had diarrhea which he attributes to his Metformin.  He states he has bouts of constipation at times and then diarrhea.   Patient denies any fever or chills.  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  Patient states he is already had Covid.  Patient lab work and chest x-ray are unremarkable.  Patient was prescribed Bactrim DS and he is to continue with Claritin for his seasonal allergies and sinus pain.  Patient is encouraged to drink clear fluids and stay hydrated for his diarrhea.  Patient is to return if any worsening of his symptoms such as fever, vomiting and if he develops any abdominal pain.  A note was written for him to stay out of work until next week due to his diarrhea.  ____________________________________________   FINAL CLINICAL IMPRESSION(S) / ED DIAGNOSES  Final diagnoses:  Diarrhea, unspecified type  Upper respiratory tract infection, unspecified type  Acute maxillary sinusitis, recurrence not specified     ED Discharge Orders         Ordered    sulfamethoxazole-trimethoprim (BACTRIM DS) 800-160 MG tablet  2 times daily     07/12/19 0935           Note:  This document was prepared using Dragon voice recognition software and may include unintentional dictation errors.    Johnn Hai, PA-C 07/12/19 1131    Duffy Bruce, MD 07/14/19 1421

## 2019-07-25 ENCOUNTER — Ambulatory Visit: Payer: PRIVATE HEALTH INSURANCE | Admitting: Nurse Practitioner

## 2019-07-25 ENCOUNTER — Encounter: Payer: Self-pay | Admitting: Nurse Practitioner

## 2019-07-25 ENCOUNTER — Other Ambulatory Visit: Payer: Self-pay

## 2019-07-25 VITALS — BP 132/74 | HR 102 | Temp 97.3°F | Ht 68.0 in | Wt 327.6 lb

## 2019-07-25 DIAGNOSIS — E119 Type 2 diabetes mellitus without complications: Secondary | ICD-10-CM | POA: Diagnosis not present

## 2019-07-25 DIAGNOSIS — J329 Chronic sinusitis, unspecified: Secondary | ICD-10-CM | POA: Diagnosis not present

## 2019-07-25 DIAGNOSIS — I8393 Asymptomatic varicose veins of bilateral lower extremities: Secondary | ICD-10-CM | POA: Diagnosis not present

## 2019-07-25 DIAGNOSIS — Z6841 Body Mass Index (BMI) 40.0 and over, adult: Secondary | ICD-10-CM

## 2019-07-25 DIAGNOSIS — E785 Hyperlipidemia, unspecified: Secondary | ICD-10-CM | POA: Diagnosis not present

## 2019-07-25 MED ORDER — SITAGLIPTIN PHOSPHATE 100 MG PO TABS: ORAL_TABLET | ORAL | 1 refills | Status: DC

## 2019-07-25 MED ORDER — METFORMIN HCL 1000 MG PO TABS: 1000.0000 mg | ORAL_TABLET | Freq: Two times a day (BID) | ORAL | 1 refills | Status: AC

## 2019-07-25 NOTE — Patient Instructions (Signed)
It was very nice to meet you today.   Please go to the lab.   We will call you with the results.   I have placed a referral in to ENT and vascular.  Please work on healthy diet/activity. Follow up in 3 mos.   Safe travels!

## 2019-07-25 NOTE — Progress Notes (Signed)
Established Patient Office Visit  Subjective:  Patient ID: Dustin Mcintyre, male    DOB: 01/25/1966  Age: 54 y.o. MRN: [id number]  CC:  Chief Complaint  Patient presents with  . Transitions Of Care    med refill/right leg pain    HPI Dustin Mcintyre is  A 53 yo DM, HLD, varicose veins, Covid infection in March, chronic sinusitis who presents to establish care with a new provider. He last saw Lauren for sinusitis infection on 10/22/2018.   T2DM/Obesity BMI 49:  DM - at goal. He is on Metformin 1000 mg twice daily and Januvia 100 mg daily . 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 He is trying to eat healthier on the road.  He is losing weight. He found himself eating protein only and had low blood sugar feeling- sweats, neck feels tight, with BS 98. He keeps Cheezits in the truck if he feels this coming on. He knows to eat some carbs now with his meals. He tried Trulicity in the past and thinks that really helped- and felt better on it. Lost weight on it. He would like to consider going back on Trulicity in place of Januvia. He does have an extreme truck driver schedule and cannot stop for bathroom breaks very often. He does check his FBS 115-145. He is already losing weight with dietary changes. Obesity not at goal but making progress.   Lab Results  Component Value Date   HGBA1C 6.4 (H) 07/25/2019   Wt Readings from Last 3 Encounters:  07/25/19 (!) 327 lb 9.6 oz (148.6 kg)  07/12/19 (!) 350 lb (158.8 kg)  04/23/18 (!) 356 lb 9.6 oz (161.8 kg)    Chronic sinusitis: Sinus infections more often and uses a  netty pot often which helps. Every 6 weeks he fights  Congestion and has needed an antibiotic 3-4 times sine Jan. He thinks it is the  altitude changes that causes this. A typical sinus infection starts with right ear fullness, ear pain, then a head cold, feels dizzy and his nose bleeds. He has found that Bactrim DS works the best. He has not had an ENT  evaluation and is in favor of one.   Varicose veins: He fell through a crate at age 34 year old. Right leg in thigh area got injured and swelled x 3 as large as the other. He has had a prominent /painful varicose vein in that right medial thigh area that still bothers him and he would like it looked at. He has bilat varicose veins.    HLD: Not at goal. Goal LDL<100. He is taking Omega 3 Fatty Acids 7672 mg tablets, garlic 0947 mg caps. Lab Results  Component Value Date   CHOL 216 (H) 04/19/2018   Lab Results  Component Value Date   HDL 48 04/19/2018   Lab Results  Component Value Date   LDLCALC 133 (H) 04/19/2018   Lab Results  Component Value Date   TRIG 173 (H) 04/19/2018   Lab Results  Component Value Date   CHOLHDL 4.5 04/19/2018    Past Medical History:  Diagnosis Date  . Diverticulitis   . Hay fever     No past surgical history on file.  Family History  Problem Relation Age of Onset  . Hypertension Father   . Hypertension Paternal Uncle     Social History   Socioeconomic History  . Marital status: Married    Spouse name: Not  on file  . Number of children: Not on file  . Years of education: Not on file  . Highest education level: Not on file  Occupational History  . Not on file  Tobacco Use  . Smoking status: Never Smoker  . Smokeless tobacco: Current User    Types: Chew  Substance and Sexual Activity  . Alcohol use: Yes  . Drug use: Never  . Sexual activity: Yes  Other Topics Concern  . Not on file  Social History Narrative  . Not on file   Social Determinants of Health   Financial Resource Strain:   . Difficulty of Paying Living Expenses:   Food Insecurity:   . Worried About Charity fundraiser in the Last Year:   . Arboriculturist in the Last Year:   Transportation Needs:   . Film/video editor (Medical):   Marland Kitchen Lack of Transportation (Non-Medical):   Physical Activity:   . Days of Exercise per Week:   . Minutes of Exercise per  Session:   Stress:   . Feeling of Stress :   Social Connections:   . Frequency of Communication with Friends and Family:   . Frequency of Social Gatherings with Friends and Family:   . Attends Religious Services:   . Active Member of Clubs or Organizations:   . Attends Archivist Meetings:   Marland Kitchen Marital Status:   Intimate Partner Violence:   . Fear of Current or Ex-Partner:   . Emotionally Abused:   Marland Kitchen Physically Abused:   . Sexually Abused:     Outpatient Medications Prior to Visit  Medication Sig Dispense Refill  . Ascorbic Acid (VITAMIN C) 1000 MG tablet Take 1,000 mg by mouth daily.    . blood glucose meter kit and supplies Dispense based on patient and insurance preference. Use up to four times daily as directed. (FOR ICD-10 E10.9, E11.9). 1 each 0  . Boswellia-Glucosamine-Vit D (OSTEO BI-FLEX ONE PER DAY PO) Take by mouth.    . fexofenadine (ALLEGRA) 180 MG tablet Take 180 mg by mouth daily.    . Garlic 7867 MG CAPS Take 1,000 mg by mouth.    Marland Kitchen glucose blood test strip Used to check blood sugars four times daily. 200 each 12  . ibuprofen (ADVIL,MOTRIN) 800 MG tablet Take 800 mg by mouth every 8 (eight) hours as needed.    . Insulin Pen Needle (PEN NEEDLES 29GX1/2") 29G X 12MM MISC Use daily with victoza 100 each 2  . Multiple Vitamin (MULTIVITAMIN) tablet Take 1 tablet by mouth daily.    . Omega-3 Fatty Acids (FISH OIL) 1000 MG CAPS Take 1,000 mg by mouth 2 (two) times daily.    Marland Kitchen omeprazole (PRILOSEC) 20 MG capsule Take 20 mg by mouth daily.    . metFORMIN (GLUCOPHAGE) 1000 MG tablet Take 1 tablet (1,000 mg total) by mouth 2 (two) times daily with a meal. 180 tablet 1  . sitaGLIPtin (JANUVIA) 100 MG tablet TAKE 1 TABLET(100 MG) BY MOUTH DAILY 90 tablet 1  . liraglutide (VICTOZA) 18 MG/3ML SOPN Inject 0.6 mg SubQ daily for 7 days, then increase dose to 1.2 mg SubQ daily (Patient not taking: Reported on 07/25/2019) 5 pen 3  . sulfamethoxazole-trimethoprim (BACTRIM DS)  800-160 MG tablet Take 1 tablet by mouth 2 (two) times daily. (Patient not taking: Reported on 07/25/2019) 20 tablet 0   No facility-administered medications prior to visit.    No Known Allergies   Review of Systems  Constitutional: Negative.   HENT:       No active sinus sx today. See HPI.   Eyes: Negative.   Respiratory: Negative for cough and shortness of breath.   Cardiovascular: Negative for chest pain and leg swelling.  Gastrointestinal: Negative for abdominal pain and diarrhea.  Genitourinary: Negative.   Musculoskeletal: Negative.        Varicose veins.   Skin: Negative.   Allergic/Immunologic: Negative.   Neurological: Negative.   Hematological: Negative.   Psychiatric/Behavioral: Negative.        No concerns with depression/anxiety.       Objective:    Physical Exam  Constitutional: He is oriented to person, place, and time. He appears well-developed and well-nourished.  HENT:  Head: Normocephalic.  Eyes: Pupils are equal, round, and reactive to light.  Cardiovascular: Regular rhythm and normal heart sounds.  No murmur heard. tachycardia  Pulmonary/Chest: Effort normal and breath sounds normal.  Abdominal: Soft. There is no abdominal tenderness.  Musculoskeletal:        General: No edema. Normal range of motion.     Cervical back: Normal range of motion and neck supple.     Comments: varicose veins bilat.   Neurological: He is alert and oriented to person, place, and time.  Skin: Skin is warm and dry.  Psychiatric: He has a normal mood and affect. His behavior is normal. Judgment and thought content normal.  GAD-7: 3, PHQ-9: 2  Vitals reviewed.   BP 132/74 (BP Location: Left Leg, Patient Position: Sitting, Cuff Size: Large)   Pulse (!) 102   Temp (!) 97.3 F (36.3 C) (Skin)   Ht 5' 8" (1.727 m)   Wt (!) 327 lb 9.6 oz (148.6 kg)   SpO2 95%   BMI 49.81 kg/m  Wt Readings from Last 3 Encounters:  07/25/19 (!) 327 lb 9.6 oz (148.6 kg)  07/12/19 (!)  350 lb (158.8 kg)  04/23/18 (!) 356 lb 9.6 oz (161.8 kg)     Health Maintenance Due  Topic Date Due  . PNEUMOCOCCAL POLYSACCHARIDE VACCINE AGE 4-64 HIGH RISK  Never done  . FOOT EXAM  Never done  . URINE MICROALBUMIN  Never done  . COVID-19 Vaccine (1) Never done  . HIV Screening  Never done    There are no preventive care reminders to display for this patient.  Lab Results  Component Value Date   TSH 1.660 07/25/2019   Lab Results  Component Value Date   WBC 8.3 07/25/2019   HGB 14.4 07/25/2019   HCT 42.9 07/25/2019   MCV 89 07/25/2019   PLT 291 07/25/2019   Lab Results  Component Value Date   NA 141 07/25/2019   K 4.6 07/25/2019   CO2 24 07/25/2019   GLUCOSE 124 (H) 07/25/2019   BUN 13 07/25/2019   CREATININE 0.62 (L) 07/25/2019   BILITOT <0.2 07/25/2019   ALKPHOS 117 07/25/2019   AST 19 07/25/2019   ALT 22 07/25/2019   PROT 7.1 07/25/2019   ALBUMIN 4.4 07/25/2019   CALCIUM 9.4 07/25/2019   ANIONGAP 7 07/12/2019   Lab Results  Component Value Date   CHOL 216 (H) 04/19/2018   Lab Results  Component Value Date   HDL 48 04/19/2018   Lab Results  Component Value Date   LDLCALC 133 (H) 04/19/2018   Lab Results  Component Value Date   TRIG 173 (H) 04/19/2018   Lab Results  Component Value Date   CHOLHDL 4.5 04/19/2018   Lab Results  Component Value Date   HGBA1C 6.4 (H) 07/25/2019      Assessment & Plan:   Problem List Items Addressed This Visit      Cardiovascular and Mediastinum   Varicose veins of both lower extremities   Relevant Orders   Ambulatory referral to Vascular Surgery     Respiratory   Chronic sinusitis   Relevant Orders   Ambulatory referral to ENT     Endocrine   Type 2 diabetes mellitus without complication, without long-term current use of insulin ([place]) - Primary   Relevant Medications   sitaGLIPtin (JANUVIA) 100 MG tablet   metFORMIN (GLUCOPHAGE) 1000 MG tablet   Other Relevant Orders   CBC with  Differential/Platelet (Completed)   TSH (Completed)   Comprehensive metabolic panel (Completed)   Hemoglobin A1c (Completed)   Microalbumin / creatinine urine ratio     Other   Hyperlipidemia   Relevant Orders   Lipid panel   BMI 45.0-49.9, adult (HCC)   Relevant Medications   sitaGLIPtin (JANUVIA) 100 MG tablet   metFORMIN (GLUCOPHAGE) 1000 MG tablet      Meds ordered this encounter  Medications  . sitaGLIPtin (JANUVIA) 100 MG tablet    Sig: TAKE 1 TABLET(100 MG) BY MOUTH DAILY    Dispense:  90 tablet    Refill:  1    Order Specific Question:   Supervising Provider    Answer:   Einar Pheasant [id number]  . metFORMIN (GLUCOPHAGE) 1000 MG tablet    Sig: Take 1 tablet (1,000 mg total) by mouth 2 (two) times daily with a meal.    Dispense:  180 tablet    Refill:  1    Order Specific Question:   Supervising Provider    Answer:   Einar Pheasant [[id number]]   Please go to the lab.   We will call you with the results.   I have placed a referral in to ENT and vascular.  Please work on healthy diet/activity. We discussed this today He has the AVS handouts from Ander Purpura' past visit. We discussed alternatives to hot dogs and lunch meats.   Follow up in 3 mos.   A total of 40 minutes was spent with patient more than half of which was spent in counseling patient on the above mentioned issues , reviewing and explaining recent labs and imaging studies done, and coordination of care.  Follow-up: Return in about 3 months (around 10/25/2019).   This visit occurred during the SARS-CoV-2 public health emergency.  Safety protocols were in place, including screening questions prior to the visit, additional usage of staff PPE, and extensive cleaning of exam room while observing appropriate contact time as indicated for disinfecting solutions.   Denice Paradise, NP

## 2019-07-26 LAB — CBC WITH DIFFERENTIAL/PLATELET
Basophils Absolute: 0.1 10*3/uL (ref 0.0–0.2)
Basos: 1 %
EOS (ABSOLUTE): 0.2 10*3/uL (ref 0.0–0.4)
Eos: 2 %
Hematocrit: 42.9 % (ref 37.5–51.0)
Hemoglobin: 14.4 g/dL (ref 13.0–17.7)
Immature Grans (Abs): 0 10*3/uL (ref 0.0–0.1)
Immature Granulocytes: 1 %
Lymphocytes Absolute: 2.6 10*3/uL (ref 0.7–3.1)
Lymphs: 31 %
MCH: 29.8 pg (ref 26.6–33.0)
MCHC: 33.6 g/dL (ref 31.5–35.7)
MCV: 89 fL (ref 79–97)
Monocytes Absolute: 0.9 10*3/uL (ref 0.1–0.9)
Monocytes: 10 %
Neutrophils Absolute: 4.6 10*3/uL (ref 1.4–7.0)
Neutrophils: 55 %
Platelets: 291 10*3/uL (ref 150–450)
RBC: 4.83 x10E6/uL (ref 4.14–5.80)
RDW: 12.6 % (ref 11.6–15.4)
WBC: 8.3 10*3/uL (ref 3.4–10.8)

## 2019-07-26 LAB — COMPREHENSIVE METABOLIC PANEL
ALT: 22 IU/L (ref 0–44)
AST: 19 IU/L (ref 0–40)
Albumin/Globulin Ratio: 1.6 (ref 1.2–2.2)
Albumin: 4.4 g/dL (ref 3.8–4.9)
Alkaline Phosphatase: 117 IU/L (ref 48–121)
BUN/Creatinine Ratio: 21 — ABNORMAL HIGH (ref 9–20)
BUN: 13 mg/dL (ref 6–24)
Bilirubin Total: <0.2 mg/dL (ref 0.0–1.2)
CO2: 24 mmol/L (ref 20–29)
Calcium: 9.4 mg/dL (ref 8.7–10.2)
Chloride: 103 mmol/L (ref 96–106)
Creatinine, Ser: 0.62 mg/dL — ABNORMAL LOW (ref 0.76–1.27)
GFR calc Af Amer: 129 mL/min/{1.73_m2} (ref >59)
GFR calc non Af Amer: 112 mL/min/{1.73_m2} (ref >59)
Globulin, Total: 2.7 g/dL (ref 1.5–4.5)
Glucose: 124 mg/dL — ABNORMAL HIGH (ref 65–99)
Potassium: 4.6 mmol/L (ref 3.5–5.2)
Sodium: 141 mmol/L (ref 134–144)
Total Protein: 7.1 g/dL (ref 6.0–8.5)

## 2019-07-26 LAB — TSH: TSH: 1.66 u[IU]/mL (ref 0.450–4.500)

## 2019-07-26 LAB — MICROALBUMIN / CREATININE URINE RATIO
Creatinine, Urine: 85.8 mg/dL
Microalb/Creat Ratio: 18 mg/g creat (ref 0–29)
Microalbumin, Urine: 15.5 ug/mL

## 2019-07-26 LAB — HEMOGLOBIN A1C
Est. average glucose Bld gHb Est-mCnc: 137 mg/dL
Hgb A1c MFr Bld: 6.4 % — ABNORMAL HIGH (ref 4.8–5.6)

## 2019-07-28 ENCOUNTER — Telehealth: Payer: Self-pay | Admitting: Nurse Practitioner

## 2019-07-28 NOTE — Telephone Encounter (Signed)
Rejection Reason - Patient Declined - PT DECLINED APPT" [facility] [place] said about 1 hour ago

## 2019-08-29 ENCOUNTER — Encounter (INDEPENDENT_AMBULATORY_CARE_PROVIDER_SITE_OTHER): Payer: Self-pay | Admitting: Nurse Practitioner

## 2019-08-29 ENCOUNTER — Encounter (INDEPENDENT_AMBULATORY_CARE_PROVIDER_SITE_OTHER): Payer: Self-pay

## 2019-09-16 ENCOUNTER — Other Ambulatory Visit (INDEPENDENT_AMBULATORY_CARE_PROVIDER_SITE_OTHER): Payer: Self-pay | Admitting: Nurse Practitioner

## 2019-09-16 DIAGNOSIS — I8393 Asymptomatic varicose veins of bilateral lower extremities: Secondary | ICD-10-CM

## 2019-09-19 ENCOUNTER — Ambulatory Visit (INDEPENDENT_AMBULATORY_CARE_PROVIDER_SITE_OTHER): Payer: PRIVATE HEALTH INSURANCE

## 2019-09-19 ENCOUNTER — Ambulatory Visit (INDEPENDENT_AMBULATORY_CARE_PROVIDER_SITE_OTHER): Payer: PRIVATE HEALTH INSURANCE | Admitting: Nurse Practitioner

## 2019-09-19 ENCOUNTER — Other Ambulatory Visit: Payer: Self-pay

## 2019-09-19 ENCOUNTER — Encounter (INDEPENDENT_AMBULATORY_CARE_PROVIDER_SITE_OTHER): Payer: Self-pay | Admitting: Nurse Practitioner

## 2019-09-19 VITALS — BP 168/102 | HR 83 | Resp 16 | Ht 68.0 in | Wt 327.0 lb

## 2019-09-19 DIAGNOSIS — R030 Elevated blood-pressure reading, without diagnosis of hypertension: Secondary | ICD-10-CM | POA: Diagnosis not present

## 2019-09-19 DIAGNOSIS — I8393 Asymptomatic varicose veins of bilateral lower extremities: Secondary | ICD-10-CM | POA: Diagnosis not present

## 2019-09-19 DIAGNOSIS — E119 Type 2 diabetes mellitus without complications: Secondary | ICD-10-CM

## 2019-09-19 DIAGNOSIS — I83813 Varicose veins of bilateral lower extremities with pain: Secondary | ICD-10-CM | POA: Diagnosis not present

## 2019-09-21 ENCOUNTER — Encounter (INDEPENDENT_AMBULATORY_CARE_PROVIDER_SITE_OTHER): Payer: Self-pay | Admitting: Nurse Practitioner

## 2019-09-21 NOTE — Progress Notes (Signed)
Subjective:    Patient ID: Markeise Mathews, male    DOB: 11-17-1965, 54 y.o.   MRN: [id number] Chief Complaint  Patient presents with  . Follow-up    ultrasound    The patient is seen for evaluation of symptomatic varicose veins. The patient relates burning and stinging which worsened steadily throughout the course of the day, particularly with standing. The patient also notes an aching and throbbing pain over the varicosities, particularly with prolonged dependent positions. The symptoms are significantly improved with elevation.  The patient also notes that during hot weather the symptoms are greatly intensified. The patient states the pain from the varicose veins interferes with work, daily exercise, shopping and household maintenance. At this point, the symptoms are persistent and severe enough that they're having a negative impact on lifestyle and are interfering with daily activities.  There is no history of DVT, PE or superficial thrombophlebitis. There is no history of ulceration or hemorrhage. The patient denies a significant family history of varicose veins.   The patient has worn graduated compression for at least 6 months, with no relief. At the present time the patient has been using over-the-counter analgesics. There is no history of prior surgical intervention or sclerotherapy.  Noninvasive studies today reveal patient has no DVT or superficial venous reflux in the right lower extremity.  patient has deep venous insufficiency seen in the right popliteal vein.  The patient has evidence of superficial venous reflux in the right great saphenous vein at the proximal thigh extending to the proximal calf.  Vein diameters range from 0.33 cm to 0.69 cm.   Review of Systems  Cardiovascular:       Large varicosities right lower extremity  All other systems reviewed and are negative.      Objective:   Physical Exam Vitals reviewed.  HENT:     Head: Normocephalic.    Cardiovascular:     Rate and Rhythm: Normal rate and regular rhythm.     Pulses: Normal pulses.  Pulmonary:     Effort: Pulmonary effort is normal.  Musculoskeletal:        General: Normal range of motion.  Neurological:     Mental Status: He is alert and oriented to person, place, and time.  Psychiatric:        Mood and Affect: Mood normal.        Behavior: Behavior normal.        Thought Content: Thought content normal.        Judgment: Judgment normal.     BP (!) 168/102 (BP Location: Right Arm)   Pulse 83   Resp 16   Ht 5' 8"  (1.727 m)   Wt (!) 327 lb (148.3 kg)   BMI 49.72 kg/m   Past Medical History:  Diagnosis Date  . Diverticulitis   . Hay fever     Social History   Socioeconomic History  . Marital status: Married    Spouse name: Not on file  . Number of children: Not on file  . Years of education: Not on file  . Highest education level: Not on file  Occupational History  . Not on file  Tobacco Use  . Smoking status: Never Smoker  . Smokeless tobacco: Current User    Types: Chew  Vaping Use  . Vaping Use: Never used  Substance and Sexual Activity  . Alcohol use: Yes  . Drug use: Never  . Sexual activity: Yes  Other Topics Concern  . Not on  file  Social History Narrative  . Not on file   Social Determinants of Health   Financial Resource Strain:   . Difficulty of Paying Living Expenses:   Food Insecurity:   . Worried About Charity fundraiser in the Last Year:   . Arboriculturist in the Last Year:   Transportation Needs:   . Film/video editor (Medical):   Marland Kitchen Lack of Transportation (Non-Medical):   Physical Activity:   . Days of Exercise per Week:   . Minutes of Exercise per Session:   Stress:   . Feeling of Stress :   Social Connections:   . Frequency of Communication with Friends and Family:   . Frequency of Social Gatherings with Friends and Family:   . Attends Religious Services:   . Active Member of Clubs or Organizations:    . Attends Archivist Meetings:   Marland Kitchen Marital Status:   Intimate Partner Violence:   . Fear of Current or Ex-Partner:   . Emotionally Abused:   Marland Kitchen Physically Abused:   . Sexually Abused:     History reviewed. No pertinent surgical history.  Family History  Problem Relation Age of Onset  . Hypertension Father   . Hypertension Paternal Uncle     No Known Allergies     Assessment & Plan:   1. Varicose veins of both lower extremities with pain Recommend  I have reviewed my previous  discussion with the patient regarding  varicose veins and why they cause symptoms. Patient will continue  wearing graduated compression stockings class 1 on a daily basis, beginning first thing in the morning and removing them in the evening.    In addition, behavioral modification including elevation during the day was again discussed and this will continue.  The patient has utilized over the counter pain medications and has been exercising.  However, at this time conservative therapy has not alleviated the patient's symptoms of leg pain and swelling  Recommend: laser ablation of the right  great saphenous veins to eliminate the symptoms of pain and swelling of the lower extremities caused by the severe superficial venous reflux disease.  2. Elevated BP without diagnosis of hypertension Patient blood pressure is elevated today.  This may be an abnormal occurrence, however repeated elevations may indicate need for treatment with hypertensive medications.  If so patient will follow up with PCP  3. Type 2 diabetes mellitus without complication, without long-term current use of insulin (HCC) Continue hypoglycemic medications as already ordered, these medications have been reviewed and there are no changes at this time.  Hgb A1C to be monitored as already arranged by primary service    Current Outpatient Medications on File Prior to Visit  Medication Sig Dispense Refill  . Ascorbic Acid  (VITAMIN C) 1000 MG tablet Take 1,000 mg by mouth daily.    . blood glucose meter kit and supplies Dispense based on patient and insurance preference. Use up to four times daily as directed. (FOR ICD-10 E10.9, E11.9). 1 each 0  . Boswellia-Glucosamine-Vit D (OSTEO BI-FLEX ONE PER DAY PO) Take by mouth.    . fexofenadine (ALLEGRA) 180 MG tablet Take 180 mg by mouth daily.    . Garlic 3335 MG CAPS Take 1,000 mg by mouth.    Marland Kitchen glucose blood test strip Used to check blood sugars four times daily. 200 each 12  . ibuprofen (ADVIL,MOTRIN) 800 MG tablet Take 800 mg by mouth every 8 (eight) hours as needed.    Marland Kitchen  Insulin Pen Needle (PEN NEEDLES 29GX1/2") 29G X 12MM MISC Use daily with victoza 100 each 2  . metFORMIN (GLUCOPHAGE) 1000 MG tablet Take 1 tablet (1,000 mg total) by mouth 2 (two) times daily with a meal. 180 tablet 1  . Multiple Vitamin (MULTIVITAMIN) tablet Take 1 tablet by mouth daily.    . Omega-3 Fatty Acids (FISH OIL) 1000 MG CAPS Take 1,000 mg by mouth 2 (two) times daily.    Marland Kitchen omeprazole (PRILOSEC) 20 MG capsule Take 20 mg by mouth daily.    . sitaGLIPtin (JANUVIA) 100 MG tablet TAKE 1 TABLET(100 MG) BY MOUTH DAILY 90 tablet 1   No current facility-administered medications on file prior to visit.    There are no Patient Instructions on file for this visit. No follow-ups on file.   Kris Hartmann, NP

## 2019-10-21 ENCOUNTER — Other Ambulatory Visit: Payer: Self-pay

## 2019-10-24 ENCOUNTER — Ambulatory Visit: Payer: PRIVATE HEALTH INSURANCE | Admitting: Nurse Practitioner

## 2019-11-10 ENCOUNTER — Other Ambulatory Visit (INDEPENDENT_AMBULATORY_CARE_PROVIDER_SITE_OTHER): Payer: PRIVATE HEALTH INSURANCE | Admitting: Vascular Surgery

## 2019-11-10 ENCOUNTER — Telehealth: Payer: PRIVATE HEALTH INSURANCE | Admitting: Internal Medicine

## 2019-11-14 ENCOUNTER — Encounter (INDEPENDENT_AMBULATORY_CARE_PROVIDER_SITE_OTHER): Payer: PRIVATE HEALTH INSURANCE

## 2019-11-17 ENCOUNTER — Encounter (INDEPENDENT_AMBULATORY_CARE_PROVIDER_SITE_OTHER): Payer: Self-pay | Admitting: Vascular Surgery

## 2019-11-17 ENCOUNTER — Other Ambulatory Visit: Payer: Self-pay

## 2019-11-17 ENCOUNTER — Ambulatory Visit (INDEPENDENT_AMBULATORY_CARE_PROVIDER_SITE_OTHER): Payer: PRIVATE HEALTH INSURANCE | Admitting: Vascular Surgery

## 2019-11-17 VITALS — BP 141/91 | HR 88 | Resp 16 | Wt 333.0 lb

## 2019-11-17 DIAGNOSIS — I83813 Varicose veins of bilateral lower extremities with pain: Secondary | ICD-10-CM

## 2019-11-18 ENCOUNTER — Encounter (INDEPENDENT_AMBULATORY_CARE_PROVIDER_SITE_OTHER): Payer: Self-pay | Admitting: Vascular Surgery

## 2019-11-18 ENCOUNTER — Other Ambulatory Visit (INDEPENDENT_AMBULATORY_CARE_PROVIDER_SITE_OTHER): Payer: Self-pay | Admitting: Vascular Surgery

## 2019-11-18 DIAGNOSIS — I83811 Varicose veins of right lower extremities with pain: Secondary | ICD-10-CM

## 2019-11-18 NOTE — Progress Notes (Signed)
    MRN : [id number]  Arjuna Doeden is a 54 y.o. (1965-05-23) male who presents with chief complaint of painful varicose veins.    The patient's right lower extremity was sterilely prepped and draped.  The ultrasound machine was used to visualize the right great saphenous vein throughout its course.  A segment below the knee was selected for access.  The saphenous vein was accessed without difficulty using ultrasound guidance with a micropuncture needle.   An 0.018  wire was placed beyond the saphenofemoral junction through the sheath and the microneedle was removed.  The 65 cm sheath was then placed over the wire and the wire and dilator were removed.  The laser fiber was placed through the sheath and its tip was placed approximately 2 cm below the saphenofemoral junction.  Tumescent anesthesia was then created with a dilute lidocaine solution.  Laser energy was then delivered with constant withdrawal of the sheath and laser fiber.  Approximately 1752 Joules of energy were delivered over a length of 43 cm.  Sterile dressings were placed.  The patient tolerated the procedure well without complications.

## 2019-11-21 ENCOUNTER — Encounter (INDEPENDENT_AMBULATORY_CARE_PROVIDER_SITE_OTHER): Payer: PRIVATE HEALTH INSURANCE

## 2019-11-21 ENCOUNTER — Encounter (INDEPENDENT_AMBULATORY_CARE_PROVIDER_SITE_OTHER): Payer: Self-pay

## 2019-11-25 ENCOUNTER — Ambulatory Visit: Payer: PRIVATE HEALTH INSURANCE

## 2019-11-25 ENCOUNTER — Telehealth (INDEPENDENT_AMBULATORY_CARE_PROVIDER_SITE_OTHER): Payer: PRIVATE HEALTH INSURANCE | Admitting: Nurse Practitioner

## 2019-11-25 ENCOUNTER — Other Ambulatory Visit: Payer: Self-pay

## 2019-11-25 ENCOUNTER — Ambulatory Visit (INDEPENDENT_AMBULATORY_CARE_PROVIDER_SITE_OTHER): Payer: PRIVATE HEALTH INSURANCE

## 2019-11-25 ENCOUNTER — Ambulatory Visit
Admission: EM | Admit: 2019-11-25 | Discharge: 2019-11-25 | Disposition: A | Discharge status: Home / Self Care | Payer: PRIVATE HEALTH INSURANCE | Attending: Physician Assistant | Admitting: Physician Assistant

## 2019-11-25 ENCOUNTER — Encounter: Payer: Self-pay | Admitting: Nurse Practitioner

## 2019-11-25 VITALS — Ht 68.0 in | Wt 333.0 lb

## 2019-11-25 DIAGNOSIS — R1310 Dysphagia, unspecified: Secondary | ICD-10-CM | POA: Diagnosis not present

## 2019-11-25 DIAGNOSIS — R1032 Left lower quadrant pain: Secondary | ICD-10-CM | POA: Insufficient documentation

## 2019-11-25 DIAGNOSIS — K5732 Diverticulitis of large intestine without perforation or abscess without bleeding: Secondary | ICD-10-CM | POA: Diagnosis not present

## 2019-11-25 DIAGNOSIS — K219 Gastro-esophageal reflux disease without esophagitis: Secondary | ICD-10-CM | POA: Insufficient documentation

## 2019-11-25 DIAGNOSIS — R140 Abdominal distension (gaseous): Secondary | ICD-10-CM | POA: Diagnosis not present

## 2019-11-25 DIAGNOSIS — E1169 Type 2 diabetes mellitus with other specified complication: Secondary | ICD-10-CM

## 2019-11-25 LAB — COMPREHENSIVE METABOLIC PANEL
ALT: 40 U/L (ref 0–44)
AST: 25 U/L (ref 15–41)
Albumin: 3.8 g/dL (ref 3.5–5.0)
Alkaline Phosphatase: 90 U/L (ref 38–126)
Anion gap: 12 (ref 5–15)
BUN: 14 mg/dL (ref 6–20)
CO2: 26 mmol/L (ref 22–32)
Calcium: 9.2 mg/dL (ref 8.9–10.3)
Chloride: 103 mmol/L (ref 98–111)
Creatinine, Ser: 0.59 mg/dL — ABNORMAL LOW (ref 0.61–1.24)
GFR calc Af Amer: >60 mL/min (ref >60)
GFR calc non Af Amer: >60 mL/min (ref >60)
Glucose, Bld: 124 mg/dL — ABNORMAL HIGH (ref 70–99)
Potassium: 3.9 mmol/L (ref 3.5–5.1)
Sodium: 141 mmol/L (ref 135–145)
Total Bilirubin: 0.5 mg/dL (ref 0.3–1.2)
Total Protein: 7.9 g/dL (ref 6.5–8.1)

## 2019-11-25 LAB — CBC WITH DIFFERENTIAL/PLATELET
Abs Immature Granulocytes: 0.07 10*3/uL (ref 0.00–0.07)
Basophils Absolute: 0.1 10*3/uL (ref 0.0–0.1)
Basophils Relative: 1 %
Eosinophils Absolute: 0.4 10*3/uL (ref 0.0–0.5)
Eosinophils Relative: 3 %
HCT: 37.3 % — ABNORMAL LOW (ref 39.0–52.0)
Hemoglobin: 12.4 g/dL — ABNORMAL LOW (ref 13.0–17.0)
Immature Granulocytes: 1 %
Lymphocytes Relative: 17 %
Lymphs Abs: 2.1 10*3/uL (ref 0.7–4.0)
MCH: 29.5 pg (ref 26.0–34.0)
MCHC: 33.2 g/dL (ref 30.0–36.0)
MCV: 88.6 fL (ref 80.0–100.0)
Monocytes Absolute: 1.4 10*3/uL — ABNORMAL HIGH (ref 0.1–1.0)
Monocytes Relative: 11 %
Neutro Abs: 8.4 10*3/uL — ABNORMAL HIGH (ref 1.7–7.7)
Neutrophils Relative %: 67 %
Platelets: 360 10*3/uL (ref 150–400)
RBC: 4.21 MIL/uL — ABNORMAL LOW (ref 4.22–5.81)
RDW: 13 % (ref 11.5–15.5)
WBC: 12.4 10*3/uL — ABNORMAL HIGH (ref 4.0–10.5)
nRBC: 0 % (ref 0.0–0.2)

## 2019-11-25 LAB — LIPASE, BLOOD: Lipase: 24 U/L (ref 11–51)

## 2019-11-25 MED ORDER — METRONIDAZOLE 500 MG PO TABS: 500.0000 mg | ORAL_TABLET | Freq: Four times a day (QID) | ORAL | 0 refills | Status: DC

## 2019-11-25 MED ORDER — CIPROFLOXACIN HCL 750 MG PO TABS: 750.0000 mg | ORAL_TABLET | Freq: Two times a day (BID) | ORAL | 0 refills | Status: AC

## 2019-11-25 NOTE — Patient Instructions (Addendum)
Please proceed to [facility]: Located in: [facility] Address: [address], [place], [place] [address] Hours:  Open ? Closes 8PM Phone: [phone]

## 2019-11-25 NOTE — ED Triage Notes (Addendum)
Patient in today w/ c/o abdominal pain. Patient states he may have a thoracic blockage.

## 2019-11-25 NOTE — Progress Notes (Signed)
Virtual Visit via video Note  This visit type was conducted due to national recommendations for restrictions regarding the COVID-19 pandemic (e.g. social distancing).  This format is felt to be most appropriate for this patient at this time.  All issues noted in this document were discussed and addressed.  No physical exam was performed (except for noted visual exam findings with Video Visits).   I connected with@ on 11/25/19 at  4:00 PM EDT by a video enabled telemedicine application or telephone and verified that I am speaking with the correct person using two identifiers. Location patient: home Location provider: work or home office Persons participating in the virtual visit: patient, his wife,  provider  I discussed the limitations, risks, security and privacy concerns of performing an evaluation and management service by telephone and the availability of in person appointments. I also discussed with the patient that there may be a patient responsible charge related to this service. The patient expressed understanding and agreed to proceed.  Interactive audio and video telecommunications were attempted between this provider and patient, however failed, due to patient having technical difficulties- could not get the camera to work.  We continued and completed visit with audio only.   Reason for visit: Having severe pain in the esophagus with swallowing and unable to eat solid food, LLQ abdominal pain with subjective fevers/chills and constipation requiring 2 bottle of Magnesium Citrate and he thinks he has diverticulitis.  He also has sinus pressure, and gets short of breath when he walks.  He had a Covid test on Monday that was negative.  He was seen at the [facility] emergency department on 11/21/2019 for chest pain.  HPI: He has a 54 year old male with history of diabetes mellitus, hyperlipidemia, varicose veins, previous Covid infection 09/27/2018,  chronic sinusitis, elevated blood pressure without  diagnosis hypertension, nicotine use disorder.   He reports that he has been doing poorly ever since he had varicose vein treatment in July.  After the laser procedure he developed a bruising, some swelling, developed chest pain with a fever, he felt like he was breathing fire.  He was seen in the urgent care on 11/09/2019 with 5-day history of central chest pain, left-sided neck pain abdominal pain.  He had taken leftover doxycycline the day before.  He was referred to the emergency department for further evaluation and work-up.  He presented to the emergency department on 11/21/2019 at [facility].  Patient reports he was having 10 out of 10 chest pain and was worried about a blood clot in the lung. The he had an elevated D-dimer, screening tests for blood clots.  CT of the chest did not show blood clots however it did show a slightly enlarged aorta in the chest and a nodule in the liver.  He was given a GI cocktail the patient reports only helped somewhat.  He was given a diagnosis of GERD, possible ulcer, and sent home with pantoprazole 40 mg daily and sucralfate 1 g 4 times daily. He was advised to get a GI referral.  He presents today reporting severe esophageal burning persist.  He is able to drink liquids and eat scrambled eggs.   Other solid food comes back up and does not pass.  He has been taking the pantoprazole 40 mg once daily in the sucralfate 4 times daily although the pills states since tends to hang out.  He feels like the burning is less severe but it is still present.  He cannot eat any solid food.  He also reports left lower quadrant abdominal pain that has turned into severe constipation.  He started taking milk of magnesia on 11/20/2019 2 doses a day that yielded very little stool.  He did take a bottle of mag citrate yesterday which yielded 3-4 bowel movements.  This is liquid brown stool.  He continued to have left lower quadrant abdominal pain without relief, took another bottle of mag  citrate today and has had 5 bowel movements this morning.  He continues to have left lower quadrant abdominal pain and rated it  5-out of 10 associated with cold sweats, and subjective  fever.  There has been no nausea or vomiting.  He thinks he has diverticulitis.   ROS: See pertinent positives and negatives per HPI.  Past Medical History:  Diagnosis Date  . Diverticulitis   . Hay fever     No past surgical history on file.  Family History  Problem Relation Age of Onset  . Hypertension Father   . Hypertension Paternal Uncle     SOCIAL HX: Current vape tobacco user   Current Outpatient Medications:  .  Ascorbic Acid (VITAMIN C) 1000 MG tablet, Take 1,000 mg by mouth daily., Disp: , Rfl:  .  blood glucose meter kit and supplies, Dispense based on patient and insurance preference. Use up to four times daily as directed. (FOR ICD-10 E10.9, E11.9)., Disp: 1 each, Rfl: 0 .  Boswellia-Glucosamine-Vit D (OSTEO BI-FLEX ONE PER DAY PO), Take by mouth., Disp: , Rfl:  .  fexofenadine (ALLEGRA) 180 MG tablet, Take 180 mg by mouth daily., Disp: , Rfl:  .  Garlic 6301 MG CAPS, Take 1,000 mg by mouth., Disp: , Rfl:  .  glucose blood test strip, Used to check blood sugars four times daily., Disp: 200 each, Rfl: 12 .  ibuprofen (ADVIL,MOTRIN) 800 MG tablet, Take 800 mg by mouth every 8 (eight) hours as needed., Disp: , Rfl:  .  Insulin Pen Needle (PEN NEEDLES 29GX1/2") 29G X 12MM MISC, Use daily with victoza, Disp: 100 each, Rfl: 2 .  metFORMIN (GLUCOPHAGE) 1000 MG tablet, Take 1 tablet (1,000 mg total) by mouth 2 (two) times daily with a meal., Disp: 180 tablet, Rfl: 1 .  Multiple Vitamin (MULTIVITAMIN) tablet, Take 1 tablet by mouth daily., Disp: , Rfl:  .  Omega-3 Fatty Acids (FISH OIL) 1000 MG CAPS, Take 1,000 mg by mouth 2 (two) times daily., Disp: , Rfl:  .  omeprazole (PRILOSEC) 20 MG capsule, Take 20 mg by mouth daily., Disp: , Rfl:  .  pantoprazole (PROTONIX) 20 MG tablet, Take by mouth.,  Disp: , Rfl:  .  sitaGLIPtin (JANUVIA) 100 MG tablet, TAKE 1 TABLET(100 MG) BY MOUTH DAILY, Disp: 90 tablet, Rfl: 1 .  sucralfate (CARAFATE) 1 g tablet, Take by mouth., Disp: , Rfl:   EXAM:  VITALS per patient if applicable:Wt 333 lbs  GENERAL: Sounds alert, oriented.   LUNGS: No sounds of respiratory distress, no cough or wheezing CV: no obvious cyanosis  PSYCH/NEURO: pleasant and cooperative, no obvious depression or anxiety, speech and thought processing grossly intact  ASSESSMENT AND PLAN:  Discussed the following assessment and plan:  LLQ abdominal pain  Dysphagia, unspecified type  Gastroesophageal reflux disease, unspecified whether esophagitis present  Type 2 diabetes mellitus with other specified complication, without long-term current use of insulin (HCC)  No problem-specific Assessment & Plan notes found for this encounter.   He presents with a several problems.  He has been self treating constipation left  lower quadrant pain with laxatives, and he has a history of diverticulitis and believes he has the same.  He reports cold sweats, subjective fever, and significant pain.  He needs imaging studies, laboratory studies, and in person evaluation.    He also has had chest pain, quite severe, diagnosed with likely esophagitis and has been placed on sucralfate and pantoprazole via recent emergency department visit on 11/21/2019.  He had taken leftover doxycycline to consider pill dysphagia.  He needs to have urgent GI referral.   The best treatment option for him this evening would be to be seen in acute care.  We discussed [facility] as they have imaging capabilities.  The patient and his wife are agreeable will go for an evaluation tonight.  Further follow-up with primary care pending.  I discussed the assessment and treatment plan with the patient. The patient was provided an opportunity to ask questions and all were answered. The patient agreed with the plan and  demonstrated an understanding of the instructions.   The patient was advised to call back or seek an in-person evaluation if the symptoms worsen or if the condition fails to improve as anticipated.  Denice Paradise, NP Adult Nurse Practitioner [place] [phone]

## 2019-11-25 NOTE — ED Provider Notes (Signed)
[facility]    CSN: [id number] Arrival date & time: 11/25/19  1812      History   Chief Complaint Chief Complaint  Patient presents with  . Abdominal Pain    HPI Dustin Mcintyre is a 54 y.o. male with a history of type 2 diabetes, hypertension, hyperlipidemia, morbid obesity, diverticulitis, and GERD presents for intermittent 2-week history of lower abdominal pain.  He also says that it feels like his abdomen is swollen.  He says that he has been having bowel movements and had 5 today following administration of laxative and suppository.  He says some of the stools were dark, but that is not unusual for him when he has a diverticulitis flareup.  He denies any fever, nausea, vomiting.  He says that he had a surgery on his varicose veins week ago and started to get anxious about that possibly causing a blood clot which caused him to go to the emergency room 4 days ago for evaluation of chest pain and pressure.  He says he had a full work-up including chest x-ray, chest CT with contrast.  He also had cardiac enzymes and he says that they ruled out a MI and also PE.  He says that the chest pressure has improved since he started being able to produce bowel movements.  He says he does still occasionally get a little short of breath when he walks but he believes it is because his abdomen is swollen.  He denies any fever, fatigue, back pain, chest pain, nausea, vomiting.  He has no other complaints or concerns.  HPI  Past Medical History:  Diagnosis Date  . Diverticulitis   . Hay fever     Patient Active Problem List   Diagnosis Date Noted  . LLQ abdominal pain 11/25/2019  . Dysphagia 11/25/2019  . Gastroesophageal reflux disease 11/25/2019  . Varicose veins of both lower extremities 07/25/2019  . Hyperlipidemia 07/25/2019  . Chronic sinusitis 07/25/2019  . BMI 45.0-49.9, adult ([place]) 07/25/2019  . Hyperlipidemia associated with type 2 diabetes mellitus ([place]) 04/23/2018  .  Elevated BP without diagnosis of hypertension 04/23/2018  . Morbid obesity ([place]) 04/23/2018  . Diabetes mellitus ([place]) 04/23/2018    History reviewed. No pertinent surgical history.     Home Medications    Prior to Admission medications   Medication Sig Start Date End Date Taking? Authorizing Provider  Ascorbic Acid (VITAMIN C) 1000 MG tablet Take 1,000 mg by mouth daily.   Yes [provider]  blood glucose meter kit and supplies Dispense based on patient and insurance preference. Use up to four times daily as directed. (FOR ICD-10 E10.9, E11.9). 04/23/18  Yes Guse, Jacquelynn Cree, FNP  Boswellia-Glucosamine-Vit D (OSTEO BI-FLEX ONE PER DAY PO) Take by mouth.   Yes [provider]  fexofenadine (ALLEGRA) 180 MG tablet Take 180 mg by mouth daily.   Yes [provider]  Garlic 1194 MG CAPS Take 1,000 mg by mouth.   Yes [provider]  glucose blood test strip Used to check blood sugars four times daily. 01/11/19  Yes Guse, Jacquelynn Cree, FNP  ibuprofen (ADVIL,MOTRIN) 800 MG tablet Take 800 mg by mouth every 8 (eight) hours as needed.   Yes [provider]  Insulin Pen Needle (PEN NEEDLES 29GX1/2") 29G X 12MM MISC Use daily with victoza 05/04/18  Yes Guse, Jacquelynn Cree, FNP  metFORMIN (GLUCOPHAGE) 1000 MG tablet Take 1 tablet (1,000 mg total) by mouth 2 (two) times daily with a meal.  07/25/19  Yes Marval Regal, NP  Multiple Vitamin (MULTIVITAMIN) tablet Take 1 tablet by mouth daily.   Yes [provider]  Omega-3 Fatty Acids (FISH OIL) 1000 MG CAPS Take 1,000 mg by mouth 2 (two) times daily.   Yes [provider]  omeprazole (PRILOSEC) 20 MG capsule Take 20 mg by mouth daily.   Yes [provider]  pantoprazole (PROTONIX) 20 MG tablet Take by mouth. 11/21/19 12/11/19 Yes [provider]  sitaGLIPtin (JANUVIA) 100 MG tablet TAKE 1 TABLET(100 MG) BY MOUTH DAILY 07/25/19  Yes Marval Regal, NP  sucralfate (CARAFATE) 1 g  tablet Take by mouth. 11/21/19 02/19/20 Yes [provider]  ciprofloxacin (CIPRO) 750 MG tablet Take 1 tablet (750 mg total) by mouth 2 (two) times daily for 7 days. 11/25/19 12/02/19  Laurene Footman B, PA-C  metroNIDAZOLE (FLAGYL) 500 MG tablet Take 1 tablet (500 mg total) by mouth 4 (four) times daily for 10 days. 11/25/19 12/05/19  Danton Clap, PA-C    Family History Family History  Problem Relation Age of Onset  . Hypertension Father   . Hypertension Paternal Uncle     Social History Social History   Tobacco Use  . Smoking status: Never Smoker  . Smokeless tobacco: Current User    Types: Chew  Vaping Use  . Vaping Use: Never used  Substance Use Topics  . Alcohol use: Yes  . Drug use: Never     Allergies   Patient has no known allergies.   Review of Systems Review of Systems  Constitutional: Negative for chills, diaphoresis, fatigue and fever.  Respiratory: Positive for shortness of breath (when walking and laying down). Negative for chest tightness.   Cardiovascular: Negative for chest pain, palpitations and leg swelling.  Gastrointestinal: Positive for abdominal pain and diarrhea. Negative for constipation, nausea and vomiting.  Genitourinary: Negative for dysuria, flank pain and testicular pain.  Musculoskeletal: Positive for neck pain. Negative for back pain.  Skin: Negative for rash.  Neurological: Negative for dizziness, weakness and headaches.     Physical Exam Triage Vital Signs ED Triage Vitals  Enc Vitals Group     BP 11/25/19 1825 (!) 163/86     Pulse Rate 11/25/19 1825 89     Resp 11/25/19 1825 (!) 22     Temp 11/25/19 1825 98.6 F (37 C)     Temp Source 11/25/19 1825 Oral     SpO2 11/25/19 1825 98 %     Weight 11/25/19 1831 (!) 333 lb (151 kg)     Height 11/25/19 1831 _0  (1.727 m)     Head Circumference --      Peak Flow --      Pain Score 11/25/19 1831 5     Pain Loc --      Pain Edu? --      Excl. in [place]? --    No data  found.  Updated Vital Signs BP (!) 163/86 (BP Location: Left Arm)   Pulse 89   Temp 98.6 F (37 C) (Oral)   Resp (!) 22   Ht _1  (1.727 m)   Wt (!) 333 lb (151 kg)   SpO2 98%   BMI 50.63 kg/m      Physical Exam Vitals and nursing note reviewed.  Constitutional:      General: He is not in acute distress.    Appearance: He is well-developed. He is obese. He is not ill-appearing, toxic-appearing or diaphoretic.  HENT:  Head: Normocephalic and atraumatic.     Right Ear: Tympanic membrane, ear canal and external ear normal.     Left Ear: Tympanic membrane, ear canal and external ear normal.     Nose: Nose normal.     Mouth/Throat:     Mouth: Mucous membranes are moist.     Pharynx: Oropharynx is clear. No posterior oropharyngeal erythema.  Eyes:     General: No scleral icterus.    Conjunctiva/sclera: Conjunctivae normal.  Cardiovascular:     Rate and Rhythm: Normal rate and regular rhythm.  Pulmonary:     Effort: Pulmonary effort is normal. No respiratory distress.     Breath sounds: Normal breath sounds. No wheezing, rhonchi or rales.     Comments: Increased respiratory rate mildly. Abdominal:     General: Bowel sounds are normal. There is distension.     Palpations: Abdomen is soft.     Tenderness: There is abdominal tenderness (LLQ, epigastric, LUQ). There is no right CVA tenderness, left CVA tenderness or rebound.  Musculoskeletal:     Cervical back: Neck supple.  Skin:    General: Skin is warm and dry.  Neurological:     General: No focal deficit present.     Mental Status: He is alert. Mental status is at baseline.     Motor: No weakness.     Gait: Gait normal.  Psychiatric:        Mood and Affect: Mood normal.        Behavior: Behavior normal.        Thought Content: Thought content normal.      UC Treatments / Results  Labs (all labs ordered are listed, but only abnormal results are displayed) Labs Reviewed  COMPREHENSIVE METABOLIC PANEL -  Abnormal; Notable for the following components:      Result Value   Glucose, Bld 124 (*)    Creatinine, Ser 0.59 (*)    All other components within normal limits  CBC WITH DIFFERENTIAL/PLATELET - Abnormal; Notable for the following components:   WBC 12.4 (*)    RBC 4.21 (*)    Hemoglobin 12.4 (*)    HCT 37.3 (*)    Neutro Abs 8.4 (*)    Monocytes Absolute 1.4 (*)    All other components within normal limits  LIPASE, BLOOD    EKG   Radiology CT ABDOMEN PELVIS WO CONTRAST  Result Date: 11/25/2019 CLINICAL DATA:  Left lower quadrant abdominal pain.  Bloating. EXAM: CT ABDOMEN AND PELVIS WITHOUT CONTRAST TECHNIQUE: Multidetector CT imaging of the abdomen and pelvis was performed following the standard protocol without IV contrast. COMPARISON:  None. FINDINGS: Lower chest: Moderate-sized pericardial effusion measures up to 2.3 cm in depth. No pleural fluid or acute airspace disease. Hepatobiliary: Enlarged liver spanning 20 cm cranial caudal. Mild hepatic steatosis. No evidence of focal lesion on noncontrast exam. Gallbladder is unremarkable. No pericholecystic inflammation or calcified gallstone. Pancreas: No ductal dilatation or inflammation. Spleen: Upper normal in size spanning 13.3 cm. No focal abnormality. Adrenals/Urinary Tract: Mild left adrenal thickening without dominant nodule. Normal right adrenal gland. No hydronephrosis. There is mild symmetric bilateral perinephric edema. Slight renal malrotation with renal pelvis directed anteriorly and lower poles coursing inferior medially. No obvious focal renal mass on noncontrast exam. Partially distended urinary bladder. No bladder stone or wall thickening. Stomach/Bowel: Inflamed diverticulum involving the proximal sigmoid colon, series 2, image 69. There is adjacent fat stranding and pericolonic edema. No pericolonic abscess or evidence of free air. There is  an inflamed diverticulum in the left upper quadrant at the splenic flexure, series 2,  image 29. No perforation or abscess. Innumerable additional colonic diverticula throughout the entire colon. Normal appendix. There is no small bowel dilatation or obstruction. Stomach is decompressed. Vascular/Lymphatic: Aorto bi-iliac atherosclerosis. Circumaortic left renal vein. Prominent periportal nodes are likely reactive. Reproductive: Prostate is unremarkable. Other: No free air or free fluid. No abscess. Fat in both inguinal canals. Musculoskeletal: Mild lower lumbar facet hypertrophy. Transitional lumbosacral anatomy. IMPRESSION: 1. Two separate sites of acute diverticulitis, 1 at the splenic flexure and 1 in the sigmoid colon. No perforation or abscess. 2. Innumerable additional colonic diverticula throughout the entire colon. 3. Hepatomegaly and hepatic steatosis. 4. Moderate-sized pericardial effusion. Aortic Atherosclerosis (ICD10-I70.0). Electronically Signed   By: Keith Rake M.D.   On: 11/25/2019 19:52    Procedures Procedures (including critical care time)  Medications Ordered in UC Medications - No data to display  Initial Impression / Assessment and Plan / UC Course  I have reviewed the triage vital signs and the nursing notes.  Pertinent labs & imaging results that were available during my care of the patient were reviewed by me and considered in my medical decision making (see chart for details).   54 year old male presenting for lower abdominal pain over the past 2 weeks.  CT scan shows 2 areas of diverticulitis.  No evidence of perforation or blockage.  CT scan also shows moderate pericardial effusion.  Pericardial effusion was noted on his CTA from 4 days ago.  I have advised patient to follow-up with his PCP about this and request cardiology referral.  BP elevated 163/86.  Advised to keep blood pressure log and follow-up with PCP in case medication needs to be changed.  His oxygen is 98% today and his chest is clear to auscultation.  I gave strict ED precautions for any  chest pain, breathing difficulty, severe back pain or neck pain or any new or worsening symptoms.  I did review his ED notes from 4 days ago which showed normal cardiac enzymes and EKGs.  CTA with negative for PE at that time.  He says that his symptoms are better than they were a few days ago as far as the chest pressure is concerned.  Advised patient to rest and increase fluids.  Advised him to make follow-up with PCP for recheck next week in person preferably.  Again, advised him to go to the ED for any new or worsening symptoms including fever or worsening abdominal pain or increased dark stools or visible blood in stools.  Patient agreeable to plan.   Final Clinical Impressions(s) / UC Diagnoses   Final diagnoses:  Diverticulitis of colon  Abdominal pain, left lower quadrant   Discharge Instructions   None    ED Prescriptions    Medication Sig Dispense Auth. Provider   metroNIDAZOLE (FLAGYL) 500 MG tablet Take 1 tablet (500 mg total) by mouth 4 (four) times daily for 10 days. 40 tablet Laurene Footman B, PA-C   ciprofloxacin (CIPRO) 750 MG tablet Take 1 tablet (750 mg total) by mouth 2 (two) times daily for 7 days. 14 tablet Gretta Cool     PDMP not reviewed this encounter.   Danton Clap, PA-C 11/25/19 2036

## 2019-11-28 ENCOUNTER — Telehealth: Payer: Self-pay | Admitting: Nurse Practitioner

## 2019-11-28 DIAGNOSIS — K219 Gastro-esophageal reflux disease without esophagitis: Secondary | ICD-10-CM

## 2019-11-28 DIAGNOSIS — K5792 Diverticulitis of intestine, part unspecified, without perforation or abscess without bleeding: Secondary | ICD-10-CM

## 2019-11-28 DIAGNOSIS — K920 Hematemesis: Secondary | ICD-10-CM

## 2019-11-28 NOTE — Telephone Encounter (Addendum)
Patient voiced understanding and stated he will return to UC at [facility] for Bright red blood when vomiting.

## 2019-11-28 NOTE — Telephone Encounter (Signed)
Pt's wife called in and said pt is throwing up blood and wants to be seen by Selena Batten. I spoke with Selena Batten and she advised patient to go to ER immediately. Pt's wife said she will try but he is refusing to go.

## 2019-11-28 NOTE — Telephone Encounter (Signed)
Called and spoke to [employer] Dustin Mcintyre refuses to go to hospital and states that he has no faith in the hospital and is in a terrible mood. He states that he is too irritable at this current moment to go into [facility] and that he does not believe they can help him at this time. He declined going to the urgent care when I asked him to and states that he will stay at home. Dustin Mcintyre states that he has been dealing with this issue since he was 19 and is discouraged from modern medicine in that we can not find the cause of his issues. He is thankful to Dustin Mcintyre for sending him to receive a stomach scan on 11/25/19. Dustin Mcintyre states that he felt fine yesterday and that he ate mashed potatoes and drunk gatorade. Today he vomited blood in the morning. He can not lay flat or it feels like he is breathing fire. He states that he has been sitting down and resting and that his pain is going away. He has antibiotics prescribed to him on the 24th that will last him until Thursday and states that he would like a work note. I scheduled him for a video visit on 12/05/19 and encouraged him to go to the hospital if he starts to feel any worse or experiences any more vomiting. Dustin Mcintyre agreed to go if his situation worsens.

## 2019-11-28 NOTE — Telephone Encounter (Signed)
(  DPR) Wife says he has finally quit vomiting and he is tired and has had no sleep his wife DPR called back and stated he would go tomorrow he will not go today. Patient would like for PCP to put in orders for him to have testing completed tomorrow, for the course of action PCP deems he needs.  I advised PCP has firmly stated and advised patient needs to be evaluated today for vomiting blood. That patient has severe esophagitis and 2 areas of diverticulitis and needs to be evaluated now , patient wife DPR states patient is refusing evaluation today he will go in the morning .

## 2019-11-28 NOTE — Telephone Encounter (Addendum)
He needs to go to the ER- [facility] if he declines [facility]. Or, back to  [facility]  for vomiting blood today. He has reported severe esophagitis, and has 2 areas of diverticulitis on CT scan.  This is not a video visit on Thurs appropriate appt.   He needs an Urgent GI referral for  vomiting blood and persistent esophagus pain and diverticulitis follow up.   I will place this GI referral, but they are not the acute care alterative today.  Please call him and advise.

## 2019-11-30 ENCOUNTER — Other Ambulatory Visit: Payer: Self-pay

## 2019-11-30 ENCOUNTER — Encounter: Payer: Self-pay | Admitting: *Deleted

## 2019-11-30 ENCOUNTER — Ambulatory Visit (INDEPENDENT_AMBULATORY_CARE_PROVIDER_SITE_OTHER): Payer: PRIVATE HEALTH INSURANCE | Admitting: Gastroenterology

## 2019-11-30 ENCOUNTER — Emergency Department
Admission: EM | Admit: 2019-11-30 | Discharge: 2019-11-30 | Disposition: A | Discharge status: Home / Self Care | Payer: PRIVATE HEALTH INSURANCE | Attending: Emergency Medicine | Admitting: Emergency Medicine

## 2019-11-30 ENCOUNTER — Encounter: Payer: Self-pay | Admitting: Gastroenterology

## 2019-11-30 VITALS — BP 143/91 | HR 94 | Temp 98.1°F | Ht 68.0 in | Wt 346.2 lb

## 2019-11-30 DIAGNOSIS — Z5321 Procedure and treatment not carried out due to patient leaving prior to being seen by health care provider: Secondary | ICD-10-CM | POA: Diagnosis not present

## 2019-11-30 DIAGNOSIS — R109 Unspecified abdominal pain: Secondary | ICD-10-CM | POA: Diagnosis not present

## 2019-11-30 DIAGNOSIS — R110 Nausea: Secondary | ICD-10-CM | POA: Insufficient documentation

## 2019-11-30 DIAGNOSIS — M546 Pain in thoracic spine: Secondary | ICD-10-CM | POA: Diagnosis not present

## 2019-11-30 DIAGNOSIS — K5732 Diverticulitis of large intestine without perforation or abscess without bleeding: Secondary | ICD-10-CM

## 2019-11-30 LAB — CBC
HCT: 34.9 % — ABNORMAL LOW (ref 39.0–52.0)
Hemoglobin: 11.5 g/dL — ABNORMAL LOW (ref 13.0–17.0)
MCH: 29.3 pg (ref 26.0–34.0)
MCHC: 33 g/dL (ref 30.0–36.0)
MCV: 89 fL (ref 80.0–100.0)
Platelets: 428 10*3/uL — ABNORMAL HIGH (ref 150–400)
RBC: 3.92 MIL/uL — ABNORMAL LOW (ref 4.22–5.81)
RDW: 12.9 % (ref 11.5–15.5)
WBC: 15.5 10*3/uL — ABNORMAL HIGH (ref 4.0–10.5)
nRBC: 0 % (ref 0.0–0.2)

## 2019-11-30 LAB — BASIC METABOLIC PANEL
Anion gap: 12 (ref 5–15)
BUN: 16 mg/dL (ref 6–20)
CO2: 22 mmol/L (ref 22–32)
Calcium: 8.4 mg/dL — ABNORMAL LOW (ref 8.9–10.3)
Chloride: 102 mmol/L (ref 98–111)
Creatinine, Ser: 0.71 mg/dL (ref 0.61–1.24)
GFR calc Af Amer: >60 mL/min (ref >60)
GFR calc non Af Amer: >60 mL/min (ref >60)
Glucose, Bld: 183 mg/dL — ABNORMAL HIGH (ref 70–99)
Potassium: 3.8 mmol/L (ref 3.5–5.1)
Sodium: 136 mmol/L (ref 135–145)

## 2019-11-30 LAB — TROPONIN I (HIGH SENSITIVITY): Troponin I (High Sensitivity): 3 ng/L (ref <18)

## 2019-11-30 LAB — LIPASE, BLOOD: Lipase: 26 U/L (ref 11–51)

## 2019-11-30 MED ORDER — AMOXICILLIN-POT CLAVULANATE 875-125 MG PO TABS: 1.0000 | ORAL_TABLET | Freq: Two times a day (BID) | ORAL | 0 refills | Status: DC

## 2019-11-30 NOTE — ED Notes (Signed)
Pt not found in lobby for vs retake 

## 2019-11-30 NOTE — ED Triage Notes (Signed)
Pt called from [facility] to treatment room, no response 

## 2019-11-30 NOTE — ED Notes (Signed)
Pt called from [facility] to treatment room, no response ?

## 2019-11-30 NOTE — Progress Notes (Signed)
Gastroenterology Consultation  Referring Provider:     Marval Regal, NP Primary Care Physician:  Marval Regal, NP Primary Gastroenterologist:  Dr. Allen Norris     Reason for Consultation:     Abdominal pain        HPI:   Dustin Mcintyre is a 54 y.o. y/o male referred for consultation & management of abdominal pain by Dr. Jerelene Redden, Janalyn Harder, NP.  This patient comes to see me after being seen in the ER on the 24th of this month for abdominal pain and a history of diverticulitis.  The patient had a CT scan that showed:  IMPRESSION: 1. Two separate sites of acute diverticulitis, 1 at the splenic flexure and 1 in the sigmoid colon. No perforation or abscess. 2. Innumerable additional colonic diverticula throughout the entire colon. 3. Hepatomegaly and hepatic steatosis. 4. Moderate-sized pericardial effusion  The patient was started on ciprofloxacin and Flagyl and told to follow-up with his primary care provider.  The patient was also recommended to see cardiology due to his moderate sized pericardial effusion.  The patient had contacted his PCP on the 27th which was 2 days ago with vomiting blood.  He was recommended to go to the ER or back to urgent care.  The patient was in the ER last night at midnight and when the triage nurse went to find him in the waiting room or the triage area he was nowhere to be found.  Prior to going to the emergency room the patient's wife had contacted his primary care provider to state that the nausea and vomiting had stopped and there is no further hematemesis.  It appears that [facility] and then at [place] on September 20 for chest pain also.  At that time the patient had reported problems with swallowing and food getting stuck and was recommended to see GI.  The patient reports that he stopped taking his Cipro and Flagyl last night because he does not feel like it is helping him.  The patient states that his  abdominal discomfort is still present although he is not having any fevers chills nausea or vomiting at present.  He also reports that his main symptom is a lot of abdominal distention and bloating.  He has been treated with Augmentin and amoxicillin in the past and he states that that has helped him better than Cipro Flagyl ever has.  He has been admitted once to the hospital for IV antibiotics.  Past Medical History:  Diagnosis Date  . Diverticulitis   . Hay fever     No past surgical history on file.  Prior to Admission medications   Medication Sig Start Date End Date Taking? Authorizing Provider  Ascorbic Acid (VITAMIN C) 1000 MG tablet Take 1,000 mg by mouth daily.    [provider]  blood glucose meter kit and supplies Dispense based on patient and insurance preference. Use up to four times daily as directed. (FOR ICD-10 E10.9, E11.9). 04/23/18   Guse, Jacquelynn Cree, FNP  Boswellia-Glucosamine-Vit D (OSTEO BI-FLEX ONE PER DAY PO) Take by mouth.    [provider]  ciprofloxacin (CIPRO) 750 MG tablet Take 1 tablet (750 mg total) by mouth 2 (two) times daily for 7 days. 11/25/19 12/02/19  Danton Clap, PA-C  fexofenadine (ALLEGRA) 180 MG tablet Take 180 mg by mouth daily.    [provider]  Garlic 7989 MG CAPS Take 1,000 mg  by mouth.    [provider]  glucose blood test strip Used to check blood sugars four times daily. 01/11/19   Guse, Jacquelynn Cree, FNP  ibuprofen (ADVIL,MOTRIN) 800 MG tablet Take 800 mg by mouth every 8 (eight) hours as needed.    [provider]  Insulin Pen Needle (PEN NEEDLES 29GX1/2") 29G X 12MM MISC Use daily with victoza 05/04/18   Guse, Jacquelynn Cree, FNP  metFORMIN (GLUCOPHAGE) 1000 MG tablet Take 1 tablet (1,000 mg total) by mouth 2 (two) times daily with a meal. 07/25/19   Marval Regal, NP  metroNIDAZOLE (FLAGYL) 500 MG tablet Take 1 tablet (500 mg total) by mouth 4 (four) times daily for 10 days. 11/25/19 12/05/19  Laurene Footman B, PA-C  Multiple Vitamin (MULTIVITAMIN) tablet Take 1 tablet by mouth daily.    [provider]  Omega-3 Fatty Acids (FISH OIL) 1000 MG CAPS Take 1,000 mg by mouth 2 (two) times daily.    [provider]  omeprazole (PRILOSEC) 20 MG capsule Take 20 mg by mouth daily.    [provider]  pantoprazole (PROTONIX) 20 MG tablet Take by mouth. 11/21/19 12/11/19  [provider]  sitaGLIPtin (JANUVIA) 100 MG tablet TAKE 1 TABLET(100 MG) BY MOUTH DAILY 07/25/19   Marval Regal, NP  sucralfate (CARAFATE) 1 g tablet Take by mouth. 11/21/19 02/19/20  [provider]    Family History  Problem Relation Age of Onset  . Hypertension Father   . Hypertension Paternal Uncle      Social History   Tobacco Use  . Smoking status: Never Smoker  . Smokeless tobacco: Current User    Types: Chew  Vaping Use  . Vaping Use: Never used  Substance Use Topics  . Alcohol use: Yes  . Drug use: Never    Allergies as of 11/30/2019  . (No Known Allergies)    Review of Systems:    All systems reviewed and negative except where noted in HPI.   Physical Exam:  There were no vitals taken for this visit. No LMP for male patient. General:   Alert,  Well-developed, well-nourished, pleasant and cooperative in NAD Head:  Normocephalic and atraumatic. Eyes:  Sclera clear, no icterus.   Conjunctiva pink. Ears:  Normal auditory acuity. Neck:  Supple; no masses or thyromegaly. Lungs:  Respirations even and unlabored.  Clear throughout to auscultation.   No wheezes, crackles, or rhonchi. No acute distress. Heart:  Regular rate and rhythm; no murmurs, clicks, rubs, or gallops. Abdomen:  Normal bowel sounds.  No bruits.  Soft, non-tender and non-distended without masses, hepatosplenomegaly or hernias noted.  No guarding or rebound tenderness.  Negative Carnett sign.   Rectal:  Deferred.  Pulses:  Normal pulses noted. Extremities:  No clubbing or edema.  No  cyanosis. Neurologic:  Alert and oriented x3;  grossly normal neurologically. Skin:  Intact without significant lesions or rashes.  No jaundice. Lymph Nodes:  No significant cervical adenopathy. Psych:  Alert and cooperative. Normal mood and affect.  Imaging Studies: CT ABDOMEN PELVIS WO CONTRAST  Result Date: 11/25/2019 CLINICAL DATA:  Left lower quadrant abdominal pain.  Bloating. EXAM: CT ABDOMEN AND PELVIS WITHOUT CONTRAST TECHNIQUE: Multidetector CT imaging of the abdomen and pelvis was performed following the standard protocol without IV contrast. COMPARISON:  None. FINDINGS: Lower chest: Moderate-sized pericardial effusion measures up to 2.3 cm in depth. No pleural fluid or acute airspace disease. Hepatobiliary: Enlarged liver spanning 20 cm cranial caudal. Mild hepatic steatosis.  No evidence of focal lesion on noncontrast exam. Gallbladder is unremarkable. No pericholecystic inflammation or calcified gallstone. Pancreas: No ductal dilatation or inflammation. Spleen: Upper normal in size spanning 13.3 cm. No focal abnormality. Adrenals/Urinary Tract: Mild left adrenal thickening without dominant nodule. Normal right adrenal gland. No hydronephrosis. There is mild symmetric bilateral perinephric edema. Slight renal malrotation with renal pelvis directed anteriorly and lower poles coursing inferior medially. No obvious focal renal mass on noncontrast exam. Partially distended urinary bladder. No bladder stone or wall thickening. Stomach/Bowel: Inflamed diverticulum involving the proximal sigmoid colon, series 2, image 69. There is adjacent fat stranding and pericolonic edema. No pericolonic abscess or evidence of free air. There is an inflamed diverticulum in the left upper quadrant at the splenic flexure, series 2, image 29. No perforation or abscess. Innumerable additional colonic diverticula throughout the entire colon. Normal appendix. There is no small bowel dilatation or obstruction. Stomach is  decompressed. Vascular/Lymphatic: Aorto bi-iliac atherosclerosis. Circumaortic left renal vein. Prominent periportal nodes are likely reactive. Reproductive: Prostate is unremarkable. Other: No free air or free fluid. No abscess. Fat in both inguinal canals. Musculoskeletal: Mild lower lumbar facet hypertrophy. Transitional lumbosacral anatomy. IMPRESSION: 1. Two separate sites of acute diverticulitis, 1 at the splenic flexure and 1 in the sigmoid colon. No perforation or abscess. 2. Innumerable additional colonic diverticula throughout the entire colon. 3. Hepatomegaly and hepatic steatosis. 4. Moderate-sized pericardial effusion. Aortic Atherosclerosis (ICD10-I70.0). Electronically Signed   By: Keith Rake M.D.   On: 11/25/2019 19:52    Assessment and Plan:   Shion Bluestein is a 54 y.o. y/o male who comes in today with a history of recurrent diverticulitis.  The patient had a CT scan also showing a paracardial effusion.  The patient has been encouraged to seek a cardiology consult due to his pericardial effusion.  The patient will also be switched to Augmentin since that has helped him in the past.  He will also continue to take the Flagyl.  He has agreed that if his symptoms get worse including fevers chills nausea vomiting or worsening abdominal pain he will go straight to the emergency room whereupon he may need a repeat CT scan and may need admission for IV antibiotics.  The patient and the patient's wife have been explained the plan and agree with it.    Dustin Lame, MD. Marval Regal    Note: This dictation was prepared with Dragon dictation along with smaller phrase technology. Any transcriptional errors that result from this process are unintentional.

## 2019-11-30 NOTE — ED Triage Notes (Signed)
Pt ambulatory to triage.  Pt has abd pain.  Hx diverticulitis.  Pt tried an enema without relief tonight.  Pt has nausea.  Pt has pain and pressure in his upper back.  Pt alert  Speech clear.

## 2019-12-05 ENCOUNTER — Encounter: Payer: Self-pay | Admitting: Nurse Practitioner

## 2019-12-05 ENCOUNTER — Other Ambulatory Visit: Payer: Self-pay

## 2019-12-05 ENCOUNTER — Telehealth (INDEPENDENT_AMBULATORY_CARE_PROVIDER_SITE_OTHER): Payer: PRIVATE HEALTH INSURANCE | Admitting: Nurse Practitioner

## 2019-12-05 ENCOUNTER — Other Ambulatory Visit
Admission: RE | Admit: 2019-12-05 | Discharge: 2019-12-05 | Disposition: A | Discharge status: Home / Self Care | Payer: PRIVATE HEALTH INSURANCE | Source: Home / Self Care | Attending: Nurse Practitioner | Admitting: Nurse Practitioner

## 2019-12-05 ENCOUNTER — Ambulatory Visit
Admission: RE | Admit: 2019-12-05 | Discharge: 2019-12-05 | Disposition: A | Discharge status: Home / Self Care | Payer: PRIVATE HEALTH INSURANCE | Source: Ambulatory Visit | Attending: Nurse Practitioner | Admitting: Nurse Practitioner

## 2019-12-05 ENCOUNTER — Ambulatory Visit
Admission: RE | Admit: 2019-12-05 | Discharge: 2019-12-05 | Disposition: A | Discharge status: Home / Self Care | Payer: PRIVATE HEALTH INSURANCE | Attending: Nurse Practitioner | Admitting: Nurse Practitioner

## 2019-12-05 VITALS — Ht 68.0 in | Wt 333.0 lb

## 2019-12-05 DIAGNOSIS — I313 Pericardial effusion (noninflammatory): Secondary | ICD-10-CM | POA: Diagnosis not present

## 2019-12-05 DIAGNOSIS — R635 Abnormal weight gain: Secondary | ICD-10-CM

## 2019-12-05 DIAGNOSIS — R0602 Shortness of breath: Secondary | ICD-10-CM

## 2019-12-05 DIAGNOSIS — R160 Hepatomegaly, not elsewhere classified: Secondary | ICD-10-CM

## 2019-12-05 DIAGNOSIS — K760 Fatty (change of) liver, not elsewhere classified: Secondary | ICD-10-CM

## 2019-12-05 DIAGNOSIS — K5720 Diverticulitis of large intestine with perforation and abscess without bleeding: Secondary | ICD-10-CM | POA: Diagnosis not present

## 2019-12-05 DIAGNOSIS — I3139 Other pericardial effusion (noninflammatory): Secondary | ICD-10-CM

## 2019-12-05 LAB — CBC WITH DIFFERENTIAL/PLATELET
Abs Immature Granulocytes: 0.1 10*3/uL — ABNORMAL HIGH (ref 0.00–0.07)
Basophils Absolute: 0.1 10*3/uL (ref 0.0–0.1)
Basophils Relative: 1 %
Eosinophils Absolute: 0.3 10*3/uL (ref 0.0–0.5)
Eosinophils Relative: 2 %
HCT: 35 % — ABNORMAL LOW (ref 39.0–52.0)
Hemoglobin: 11.7 g/dL — ABNORMAL LOW (ref 13.0–17.0)
Immature Granulocytes: 1 %
Lymphocytes Relative: 13 %
Lymphs Abs: 1.6 10*3/uL (ref 0.7–4.0)
MCH: 28.9 pg (ref 26.0–34.0)
MCHC: 33.4 g/dL (ref 30.0–36.0)
MCV: 86.4 fL (ref 80.0–100.0)
Monocytes Absolute: 1.1 10*3/uL — ABNORMAL HIGH (ref 0.1–1.0)
Monocytes Relative: 9 %
Neutro Abs: 9.5 10*3/uL — ABNORMAL HIGH (ref 1.7–7.7)
Neutrophils Relative %: 74 %
Platelets: 400 10*3/uL (ref 150–400)
RBC: 4.05 MIL/uL — ABNORMAL LOW (ref 4.22–5.81)
RDW: 13.3 % (ref 11.5–15.5)
WBC: 12.7 10*3/uL — ABNORMAL HIGH (ref 4.0–10.5)
nRBC: 0 % (ref 0.0–0.2)

## 2019-12-05 LAB — COMPREHENSIVE METABOLIC PANEL
ALT: 22 U/L (ref 0–44)
AST: 16 U/L (ref 15–41)
Albumin: 3.7 g/dL (ref 3.5–5.0)
Alkaline Phosphatase: 74 U/L (ref 38–126)
Anion gap: 11 (ref 5–15)
BUN: 8 mg/dL (ref 6–20)
CO2: 25 mmol/L (ref 22–32)
Calcium: 9.1 mg/dL (ref 8.9–10.3)
Chloride: 104 mmol/L (ref 98–111)
Creatinine, Ser: 0.55 mg/dL — ABNORMAL LOW (ref 0.61–1.24)
GFR calc Af Amer: >60 mL/min (ref >60)
GFR calc non Af Amer: >60 mL/min (ref >60)
Glucose, Bld: 154 mg/dL — ABNORMAL HIGH (ref 70–99)
Potassium: 3.7 mmol/L (ref 3.5–5.1)
Sodium: 140 mmol/L (ref 135–145)
Total Bilirubin: 0.8 mg/dL (ref 0.3–1.2)
Total Protein: 7.7 g/dL (ref 6.5–8.1)

## 2019-12-05 LAB — BRAIN NATRIURETIC PEPTIDE: B Natriuretic Peptide: 97.7 pg/mL (ref 0.0–100.0)

## 2019-12-05 NOTE — Progress Notes (Signed)
Virtual Visit via Virtual Note  This visit type was conducted due to national recommendations for restrictions regarding the COVID-19 pandemic (e.g. social distancing).  This format is felt to be most appropriate for this patient at this time.  All issues noted in this document were discussed and addressed.  No physical exam was performed (except for noted visual exam findings with Video Visits).   I connected with@ on 12/05/19 at  8:00 AM EDT by a video enabled telemedicine application or telephone and verified that I am speaking with the correct person using two identifiers. Location patient: home Location provider: work or home office Persons participating in the virtual visit: patient, provider  I discussed the limitations, risks, security and privacy concerns of performing an evaluation and management service by telephone and the availability of in person appointments. I also discussed with the patient that there may be a patient responsible charge related to this service. The patient expressed understanding and agreed to proceed.   Reason for visit: Reported vomiting blood last week.  He was diagnosed with diverticulitis in 2 areas of the colon.  He has seen Dr. Allen Norris from gastroenterology last week and is on Augmentin and Flagyl showing improvement.   HPI: This 54 year old male has history of diabetes mellitus, chronic sinusitis, varicose veins, hyperlipidemia, Covid infection 09/27/18, elevated blood pressure without diagnosis hypertension, nicotine use disorder, recurrent diverticulitis comes in for follow-up acute diverticulitis on Augmentin and Flagyl followed by gastroenterology.  He is feeling better, tolerating diet well and staying hydrated without fevers or chills.    11/09/2019: He was seen at Urgent Care in [facility] for:  Chest pain central chest, left  neck and abdominal pain.  He had taken leftover doxycycline and noted black-colored stools.  His chest pain was associated with  palpitations, SOB when  eating or laying on left side. He was sent from UC to the ED. He did not seek treatment.   11/17/2019: He saw Dr. Delana Meyer  for varicose vein treatment.   11/21/2019: He went to the [facility] ED for constant, severe upper chest pain associated with shortness of breath that occurred after eating dinner.  D-dimer 747.  CTA chest revealed no PE, limited exam given contrast bolus timing and patient body habitus, partially high visualized hypoattenuating lesion in the right lobe of liver that may represent benign hemangioma although dedicated CT or MRI of the abdomen pelvis is recommended for further characterization, dilated ascending thoracic aorta measuring up to 4.3 cm.  EKG obtained without ST changes.  Troponin negative.  Negative Covid.  Suspected GERD with possible stricture treated with Protonix Carafate.  He was advised to see GI referral.  11/25/2019: He was seen for video visit with his history, reporting severe esophageal burning, tolerating only soft foods, dysphagia, vomiting solid foods despite pantoprazole 40 mg daily and sucralfate 4 times daily.  He also is reporting left lower quadrant abdominal pain severe constipation taking large amounts of laxatives without improvement.  Is reporting shortness of breath with walking or lying supine.  He was referred to [facility] for in person evaluation and imaging.  11/25/2019: He was seen at [facility] for above symptoms: Shortness of breath, abdominal pain, diarrhea, neck pain.  Blood pressure 163/86 respirations 22 pulse 89,  SpO2 98%.  CT of the abdomen show 2 separate sites of acute diverticulitis. No evidence of perforation or blockage.  Innumerable additional colonic diverticuli throughout the entire colon, hepatomegaly hepatic steatosis, moderate sized pericardial effusion.  Aortic atherosclerosis.  He was advised to obtain cardiology referral, keep blood pressure log, and begin Flagyl 500 mg 4 times daily  for 10 days and Cipro 750 mg twice daily for 7 days.  11/28/2019: He telephoned in the office that he was vomiting blood, feels like he is breathing fire and cannot lay flat.  He was advised to seek in person treatment at the ED- declined. Then advised [facility]. Urgent referral placed to GI.  11/30/2019: He went to [facility] ED and left without being seen.   11/30/2019: He was seen by Dr. Allen Norris In Gastroenterology.  His antibiotics were switched to Augmentin since that has helped him in the past and he was advised to continue Flagyl.  Patient reports that because of recurrent diverticulitis, there was discussion about eventual referral to general surgery for colon resection.   Currently, he presents today via video visit. He is taking the Augmentin and Flagyl as directed. Diverticulitis improving with stools solid as of yesterday. He has decreased abdominal swelling. He is tolerating his diet well.  He has noted no fevers or chills. No blood or melena in stool. GERD:  No esophageal burning or current dysphagia on pantoprazole 40 mg daily.  Moderate sized pericardial effusion:  He still has shortness of breath, no chest pain. Reports unintentional Wt gain: He needs urgent cardiac referral for moderate sized pericardial effusion.  DM: Off of his DM since he had bad diverticulitis: Metformin 1000 mg BID and off it x 3 weeks. Januvia 100 mg once daily and off x 3 weeks. His FBS: 200, and they were at goal prior.   Lower edema: Schneir 11/17/19 - laser surgery.   Elevated blood pressure without history of hypertension: Recent readings elevated, associated with stress, abdominal pain, to be monitored. BP Readings from Last 3 Encounters:  11/30/19 (!) 143/91  11/30/19 (!) 144/98  11/25/19 (!) 163/86   Morbid obesity BMI 50/Fatty liver and enlarged liver on CT: Weight loss, diabetes management, serial liver enzymes  Mild left adrenal thickening incidental finding on CT acute diverticulitis: Will need  followed  ROS: See pertinent positives and negatives per HPI.  Past Medical History:  Diagnosis Date  . Diverticulitis   . Hay fever     No past surgical history on file.  Family History  Problem Relation Age of Onset  . Hypertension Father   . Hypertension Paternal Uncle     SOCIAL HX: Nicotine dependence: Uses chewing tobacco.  Negative alcohol.  Current Outpatient Medications:  .  amoxicillin-clavulanate (AUGMENTIN) 875-125 MG tablet, Take 1 tablet by mouth 2 (two) times daily., Disp: 28 tablet, Rfl: 0 .  Ascorbic Acid (VITAMIN C) 1000 MG tablet, Take 1,000 mg by mouth daily., Disp: , Rfl:  .  blood glucose meter kit and supplies, Dispense based on patient and insurance preference. Use up to four times daily as directed. (FOR ICD-10 E10.9, E11.9)., Disp: 1 each, Rfl: 0 .  Boswellia-Glucosamine-Vit D (OSTEO BI-FLEX ONE PER DAY PO), Take by mouth., Disp: , Rfl:  .  fexofenadine (ALLEGRA) 180 MG tablet, Take 180 mg by mouth daily., Disp: , Rfl:  .  Garlic 9233 MG CAPS, Take 1,000 mg by mouth., Disp: , Rfl:  .  glucose blood test strip, Used to check blood sugars four times daily., Disp: 200 each, Rfl: 12 .  ibuprofen (ADVIL,MOTRIN) 800 MG tablet, Take 800 mg by mouth every 8 (eight) hours as needed., Disp: , Rfl:  .  Insulin Pen Needle (PEN NEEDLES 29GX1/2") 29G X  12MM MISC, Use daily with victoza, Disp: 100 each, Rfl: 2 .  metFORMIN (GLUCOPHAGE) 1000 MG tablet, Take 1 tablet (1,000 mg total) by mouth 2 (two) times daily with a meal., Disp: 180 tablet, Rfl: 1 .  Multiple Vitamin (MULTIVITAMIN) tablet, Take 1 tablet by mouth daily., Disp: , Rfl:  .  Omega-3 Fatty Acids (FISH OIL) 1000 MG CAPS, Take 1,000 mg by mouth 2 (two) times daily., Disp: , Rfl:  .  omeprazole (PRILOSEC) 20 MG capsule, Take 20 mg by mouth daily. , Disp: , Rfl:  .  pantoprazole (PROTONIX) 20 MG tablet, Take by mouth., Disp: , Rfl:  .  sitaGLIPtin (JANUVIA) 100 MG tablet, TAKE 1 TABLET(100 MG) BY MOUTH DAILY,  Disp: 90 tablet, Rfl: 1 .  sucralfate (CARAFATE) 1 g tablet, Take by mouth., Disp: , Rfl:   EXAM:  VITALS per patient if applicable:Wt reported 333 lbs.   GENERAL: alert, oriented, appears well and in no acute distress  HEENT: atraumatic, conjunctiva clear, no obvious abnormalities on inspection of external nose and ears  NECK: normal movements of the head and neck  LUNGS: on inspection no signs of respiratory distress, breathing rate appears normal, no obvious gross SOB, gasping or wheezing  CV: no obvious cyanosis  MS: moves all visible extremities without noticeable abnormality  PSYCH/NEURO: pleasant and cooperative, no obvious depression or anxiety, speech and thought processing grossly intact  ASSESSMENT AND PLAN:  Discussed the following assessment and plan:  Diverticulitis of large intestine with perforation without abscess or bleeding - Plan: Comp Met (CMET), CBC with Differential/Platelet  Pericardial effusion - Plan: DG Chest 2 View, B Nat Peptide, Ambulatory referral to Cardiology  SOB (shortness of breath) - Plan: DG Chest 2 View, CBC with Differential/Platelet, B Nat Peptide, Ambulatory referral to Cardiology  Weight gain - Plan: Ambulatory referral to Cardiology  Hepatomegaly  Hepatic steatosis  Morbid obesity ([place])  No problem-specific Assessment & Plan notes found for this encounter.  Lab addendum: Please call with labs showing improvement in infection markers in the CBC- his WBC is improving- take all of the Augmentin and flagyl as prescribed for diverticulitis. Make a f/up appt  with Dr. Allen Norris in GI to follow.   He has been off of his Metformin and Januvia for 3 weeks with this illness. BS is mildly elevated - at his baseline. His renal function is Ok and he is eating and drinking well. He can begin Metformin 500 mg with eve meal and monitor for diarrhea. Take it slowly starting back on this medication. Hold the Januvia for now. Please check FBS daily and  record. Does he need more supplies to do this?   The BNP- signals heart failure- was normal. The CXR sowed persistent cardiomegaly likely r/t pericardial effusion. A stat Cardiology consult was placed.  Monitor  your BP at home and call in 3 days of readings. Monitor weight. Is he getting lower extremity edema- above baseline? Monitor for chest pain and worsening SOB- if this occurs- seek care at the ED.   He needs f/up OV with me in person in 3-4 weeks.   I discussed the assessment and treatment plan with the patient. The patient was provided an opportunity to ask questions and all were answered. The patient agreed with the plan and demonstrated an understanding of the instructions.   The patient was advised to call back or seek an in-person evaluation if the symptoms worsen or if the condition fails to improve as anticipated.  This visit  occurred during the SARS-CoV-2 public health emergency.  Safety protocols were in place, including screening questions prior to the visit, additional usage of staff PPE, and extensive cleaning of exam room while observing appropriate contact time as indicated for disinfecting solutions.   Denice Paradise, NP Adult Nurse Practitioner [place] [phone]

## 2019-12-09 ENCOUNTER — Other Ambulatory Visit: Payer: Self-pay

## 2019-12-09 ENCOUNTER — Ambulatory Visit (INDEPENDENT_AMBULATORY_CARE_PROVIDER_SITE_OTHER): Payer: PRIVATE HEALTH INSURANCE | Admitting: Cardiology

## 2019-12-09 ENCOUNTER — Encounter: Payer: Self-pay | Admitting: Cardiology

## 2019-12-09 VITALS — BP 130/82 | HR 100 | Ht 68.0 in | Wt 331.0 lb

## 2019-12-09 DIAGNOSIS — R0602 Shortness of breath: Secondary | ICD-10-CM | POA: Diagnosis not present

## 2019-12-09 DIAGNOSIS — I3139 Other pericardial effusion (noninflammatory): Secondary | ICD-10-CM

## 2019-12-09 DIAGNOSIS — I313 Pericardial effusion (noninflammatory): Secondary | ICD-10-CM | POA: Diagnosis not present

## 2019-12-09 MED ORDER — TORSEMIDE 20 MG PO TABS: 20.0000 mg | ORAL_TABLET | Freq: Every day | ORAL | 5 refills | Status: DC

## 2019-12-09 MED ORDER — POTASSIUM CHLORIDE ER 10 MEQ PO TBCR: 10.0000 meq | EXTENDED_RELEASE_TABLET | Freq: Every day | ORAL | 5 refills | Status: DC

## 2019-12-09 NOTE — Progress Notes (Signed)
Cardiology Office Note:    Date:  12/09/2019   ID:  Dustin Mcintyre, DOB 10-09-1965, MRN [id number]  PCP:  Marval Regal, NP  [facility] Cardiologist:  Kate Sable, MD  [place] Electrophysiologist:  None   Referring MD: Marval Regal, NP   Chief Complaint  Patient presents with  . New Patient (Initial Visit)    Establish care with provider for pericardial effusion, SOB and unknown weight gain per referral. Medications verbally reviewed with patient.    Dustin Mcintyre is a 54 y.o. male who is being seen today for the evaluation of shortness of breath, pericardial effusion at the request of Marval Regal, NP.   History of Present Illness:    Dustin Mcintyre is a 54 y.o. male with a hx of diabetes, GERD who presents due to pericardial effusion.  Patient had an abdominal CT on 11/25/2019 in the ED after presenting due to left lower quadrant abdominal pain and bloating.  He was diagnosed with acute diverticulitis.  Started on antibiotics. CT scan did reveal pericardial effusion measuring up to 2.3 cm in depth anteriorly.  A follow-up chest x-ray on 12/05/2019 was read as showing persistent cardiomegaly, likely related to pericardial effusion.  CT scan on 11/21/2019 obtained in the ED when patient presented for chest pain.  Elevated D-dimers was noted, CTA was negative for PE.  Dilated ascending aorta measuring 4.3 cm was noted.  Symptoms were deemed secondary to reflux, patient started on Protonix and Carafate. Protonix and Carafate have help with patient's symptoms. He follows up with gastroenterology.  BNP obtained on 10/4 was normal.  Creatinine also normal. Patient also endorses shortness of breath since being diagnosed with diverticulitis. He symptoms of diverticulitis/abdominal pain have improved since starting antibiotics. Denies any history of heart disease.  Past Medical History:  Diagnosis Date  . Diverticulitis   . Hay fever     History reviewed. No  pertinent surgical history.  Current Medications: Current Meds  Medication Sig  . amoxicillin-clavulanate (AUGMENTIN) 875-125 MG tablet Take 1 tablet by mouth 2 (two) times daily.  . Ascorbic Acid (VITAMIN C) 1000 MG tablet Take 1,000 mg by mouth daily.  . blood glucose meter kit and supplies Dispense based on patient and insurance preference. Use up to four times daily as directed. (FOR ICD-10 E10.9, E11.9).  Marland Kitchen Boswellia-Glucosamine-Vit D (OSTEO BI-FLEX ONE PER DAY PO) Take by mouth.  . fexofenadine (ALLEGRA) 180 MG tablet Take 180 mg by mouth daily.  . Garlic 5053 MG CAPS Take 1,000 mg by mouth.  Marland Kitchen glucose blood test strip Used to check blood sugars four times daily.  Marland Kitchen ibuprofen (ADVIL,MOTRIN) 800 MG tablet Take 800 mg by mouth every 8 (eight) hours as needed.  . Insulin Pen Needle (PEN NEEDLES 29GX1/2") 29G X 12MM MISC Use daily with victoza  . metFORMIN (GLUCOPHAGE) 1000 MG tablet Take 1 tablet (1,000 mg total) by mouth 2 (two) times daily with a meal.  . Multiple Vitamin (MULTIVITAMIN) tablet Take 1 tablet by mouth daily.  . Omega-3 Fatty Acids (FISH OIL) 1000 MG CAPS Take 1,000 mg by mouth 2 (two) times daily.  Marland Kitchen omeprazole (PRILOSEC) 20 MG capsule Take 20 mg by mouth daily.   . pantoprazole (PROTONIX) 20 MG tablet Take by mouth.  . sitaGLIPtin (JANUVIA) 100 MG tablet TAKE 1 TABLET(100 MG) BY MOUTH DAILY  . sucralfate (CARAFATE) 1 g tablet Take by mouth.     Allergies:   Patient has no known allergies.   Social  History   Socioeconomic History  . Marital status: Married    Spouse name: Not on file  . Number of children: Not on file  . Years of education: Not on file  . Highest education level: Not on file  Occupational History  . Not on file  Tobacco Use  . Smoking status: Never Smoker  . Smokeless tobacco: Current User    Types: Chew  Vaping Use  . Vaping Use: Never used  Substance and Sexual Activity  . Alcohol use: Yes  . Drug use: Never  . Sexual activity: Yes    Other Topics Concern  . Not on file  Social History Narrative  . Not on file   Social Determinants of Health   Financial Resource Strain:   . Difficulty of Paying Living Expenses: Not on file  Food Insecurity:   . Worried About Charity fundraiser in the Last Year: Not on file  . Ran Out of Food in the Last Year: Not on file  Transportation Needs:   . Lack of Transportation (Medical): Not on file  . Lack of Transportation (Non-Medical): Not on file  Physical Activity:   . Days of Exercise per Week: Not on file  . Minutes of Exercise per Session: Not on file  Stress:   . Feeling of Stress : Not on file  Social Connections:   . Frequency of Communication with Friends and Family: Not on file  . Frequency of Social Gatherings with Friends and Family: Not on file  . Attends Religious Services: Not on file  . Active Member of Clubs or Organizations: Not on file  . Attends Archivist Meetings: Not on file  . Marital Status: Not on file     Family History: The patient's family history includes Hypertension in his father and paternal uncle.  ROS:   Please see the history of present illness.     All other systems reviewed and are negative.  EKGs/Labs/Other Studies Reviewed:    The following studies were reviewed today:   EKG:  EKG is  ordered today.  The ekg ordered today demonstrates normal sinus rhythm.  Recent Labs: 07/25/2019: TSH 1.660 12/05/2019: ALT 22; B Natriuretic Peptide 97.7; BUN 8; Creatinine, Ser 0.55; Hemoglobin 11.7; Platelets 400; Potassium 3.7; Sodium 140  Recent Lipid Panel    Component Value Date/Time   CHOL 216 (H) 04/19/2018 0949   TRIG 173 (H) 04/19/2018 0949   HDL 48 04/19/2018 0949   CHOLHDL 4.5 04/19/2018 0949   LDLCALC 133 (H) 04/19/2018 0949     Risk Assessment/Calculations:       Physical Exam:    VS:  BP 130/82 (BP Location: Right Arm, Patient Position: Sitting, Cuff Size: Large)   Pulse 100   Ht _0  (1.727 m)   Wt (!)  331 lb (150.1 kg)   SpO2 95%   BMI 50.33 kg/m     Wt Readings from Last 3 Encounters:  12/09/19 (!) 331 lb (150.1 kg)  12/05/19 (!) 333 lb (151 kg)  11/30/19 (!) 346 lb 3.2 oz (157 kg)     GEN:  Well nourished, well developed in no acute distress HEENT: Normal NECK: No JVD; No carotid bruits LYMPHATICS: No lymphadenopathy CARDIAC: RRR, no murmurs, rubs, gallops RESPIRATORY:  Clear to auscultation without rales, wheezing or rhonchi  ABDOMEN: Soft, non-tender, distended MUSCULOSKELETAL:  No edema; No deformity  SKIN: Warm and dry NEUROLOGIC:  Alert and oriented x 3 PSYCHIATRIC:  Normal affect   ASSESSMENT:  1. Pericardial effusion   2. Shortness of breath   3. Morbid obesity ([place])    PLAN:    In order of problems listed above:  1. History of pericardial effusion.  CT scan on 11/25/2019 showing large pericardial effusion measuring up to 2.3 cm anteriorly.  Will get echocardiogram to evaluate LV size and function, size of effusion if present. Start torsemide 20 mg daily. 2. Patient with shortness of breath with activity. Etiology could be fluid overload, obesity. Torsemide as above, echocardiogram as above. 3. Patient is morbidly obese.  Low-calorie diet, weight loss advised.  Follow-up after echocardiogram.  Total encounter time 80 minutes  Greater than 50% was spent in counseling and coordination of care with the patient   Medication Adjustments/Labs and Tests Ordered: Current medicines are reviewed at length with the patient today.  Concerns regarding medicines are outlined above.  Orders Placed This Encounter  Procedures  . EKG 12-Lead  . ECHOCARDIOGRAM COMPLETE   Meds ordered this encounter  Medications  . torsemide (DEMADEX) 20 MG tablet    Sig: Take 1 tablet (20 mg total) by mouth daily.    Dispense:  30 tablet    Refill:  5  . potassium chloride (KLOR-CON) 10 MEQ tablet    Sig: Take 1 tablet (10 mEq total) by mouth daily.    Dispense:  30 tablet     Refill:  5    Patient Instructions  Medication Instructions:  Your physician has recommended you make the following change in your medication:   1)  START torsemide (DEMADEX) 20 MG tablet:  Take 1 tablet (20 mg total) by mouth daily. 2)  START potassium chloride (KLOR-CON) 10 MEQ tablet:  Take 1 tablet (10 mEq total) by mouth daily.   *If you need a refill on your cardiac medications before your next appointment, please call your pharmacy*   Lab Work: None Ordered If you have labs (blood work) drawn today and your tests are completely normal, you will receive your results only by: Marland Kitchen MyChart Message (if you have MyChart) OR . A paper copy in the mail If you have any lab test that is abnormal or we need to change your treatment, we will call you to review the results.   Testing/Procedures:  Your physician has requested that you have an echocardiogram ASAP. Echocardiography is a painless test that uses sound waves to create images of your heart. It provides your doctor with information about the size and shape of your heart and how well your heart's chambers and valves are working. This procedure takes approximately one hour. There are no restrictions for this procedure.    Follow-Up: At [facility], you and your health needs are our priority.  As part of our continuing mission to provide you with exceptional heart care, we have created designated Provider Care Teams.  These Care Teams include your primary Cardiologist (physician) and Advanced Practice Providers (APPs -  Physician Assistants and Nurse Practitioners) who all work together to provide you with the care you need, when you need it.  We recommend signing up for the patient portal called "MyChart".  Sign up information is provided on this After Visit Summary.  MyChart is used to connect with patients for Virtual Visits (Telemedicine).  Patients are able to view lab/test results, encounter notes, upcoming appointments, etc.   Non-urgent messages can be sent to your provider as well.   To learn more about what you can do with MyChart, go to [email or web address].  Your next appointment:   Follow up after Echo   The format for your next appointment:   In Person  Provider:   Kate Sable, MD  ONLY   Other Instructions      Signed, Kate Sable, MD  12/09/2019 12:58 PM    [place]

## 2019-12-09 NOTE — Patient Instructions (Addendum)
Medication Instructions:  Your physician has recommended you make the following change in your medication:   1)  START torsemide (DEMADEX) 20 MG tablet:  Take 1 tablet (20 mg total) by mouth daily. 2)  START potassium chloride (KLOR-CON) 10 MEQ tablet:  Take 1 tablet (10 mEq total) by mouth daily.   *If you need a refill on your cardiac medications before your next appointment, please call your pharmacy*   Lab Work: None Ordered If you have labs (blood work) drawn today and your tests are completely normal, you will receive your results only by: Marland Kitchen MyChart Message (if you have MyChart) OR . A paper copy in the mail If you have any lab test that is abnormal or we need to change your treatment, we will call you to review the results.   Testing/Procedures:  Your physician has requested that you have an echocardiogram ASAP. Echocardiography is a painless test that uses sound waves to create images of your heart. It provides your doctor with information about the size and shape of your heart and how well your heart's chambers and valves are working. This procedure takes approximately one hour. There are no restrictions for this procedure.    Follow-Up: At [facility], you and your health needs are our priority.  As part of our continuing mission to provide you with exceptional heart care, we have created designated Provider Care Teams.  These Care Teams include your primary Cardiologist (physician) and Advanced Practice Providers (APPs -  Physician Assistants and Nurse Practitioners) who all work together to provide you with the care you need, when you need it.  We recommend signing up for the patient portal called "MyChart".  Sign up information is provided on this After Visit Summary.  MyChart is used to connect with patients for Virtual Visits (Telemedicine).  Patients are able to view lab/test results, encounter notes, upcoming appointments, etc.  Non-urgent messages can be sent to your  provider as well.   To learn more about what you can do with MyChart, go to [email or web address].    Your next appointment:   Follow up after Echo   The format for your next appointment:   In Person  Provider:   Debbe Odea, MD  ONLY   Other Instructions

## 2019-12-12 ENCOUNTER — Ambulatory Visit: Payer: PRIVATE HEALTH INSURANCE | Admitting: Cardiology

## 2019-12-12 ENCOUNTER — Telehealth: Payer: Self-pay

## 2019-12-12 ENCOUNTER — Other Ambulatory Visit: Payer: Self-pay

## 2019-12-12 MED ORDER — AMOXICILLIN-POT CLAVULANATE 875-125 MG PO TABS: 1.0000 | ORAL_TABLET | Freq: Two times a day (BID) | ORAL | 0 refills | Status: DC

## 2019-12-12 MED ORDER — METRONIDAZOLE 500 MG PO TABS: 500.0000 mg | ORAL_TABLET | Freq: Three times a day (TID) | ORAL | 0 refills | Status: DC

## 2019-12-12 NOTE — Telephone Encounter (Signed)
Pt called back returning your call °

## 2019-12-12 NOTE — Telephone Encounter (Signed)
Pt's wife advised of the ok per Dr. Servando Snare to refill the Augmentin and Flagyl. Advised pt's wife to contact our office if after this 2nd round he is not improving as he will need an office appt and possible surgical referral. Pt's wife verbalized understanding and will call when medication has been completed.

## 2019-12-12 NOTE — Telephone Encounter (Signed)
Pt's wife said he needs to refill on Augmentin and metronidazole that was prescribed by [facility]. She said that Selena Batten has been working with Dr. Servando Snare regarding his diverticulitis and he will be out of medication this week. She tried contacting Dr. Annabell Sabal office on Friday for a refill but was unable to get anyone to answer. She would like a call back.

## 2019-12-12 NOTE — Telephone Encounter (Signed)
LMTCB

## 2019-12-12 NOTE — Telephone Encounter (Signed)
Spoke with patients wife and states they were able to get patients medication from Dr. Servando Snare

## 2019-12-12 NOTE — Telephone Encounter (Signed)
-----   Message from Midge Minium, MD sent at 12/12/2019 10:44 AM EDT ----- Regarding: RE: Flagyl is three times a day. And that is fine ----- Message ----- From: Rayann Heman, CMA Sent: 12/12/2019  10:40 AM EDT To: Midge Minium, MD  Pt's wife called stating her husband feels he needs 1 more round of antibiotics for his diverticulitis. He will be done taking the Augmentin and Flagyl in 3 days. 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 Please advise if it okay to resend Augmentin and Flagyl. If so, Flagyl BID or TID?

## 2019-12-12 NOTE — Telephone Encounter (Signed)
He needs to contact Dr. Servando Snare and should have another office visit for examination if more medication is needed. Contact one of his GI partners if Dr. Servando Snare is not available. Please call their office.

## 2019-12-19 ENCOUNTER — Ambulatory Visit
Admission: RE | Admit: 2019-12-19 | Discharge: 2019-12-19 | Disposition: A | Discharge status: Home / Self Care | Payer: PRIVATE HEALTH INSURANCE | Source: Ambulatory Visit | Attending: Cardiology | Admitting: Cardiology

## 2019-12-19 ENCOUNTER — Other Ambulatory Visit: Payer: Self-pay

## 2019-12-19 DIAGNOSIS — I3139 Other pericardial effusion (noninflammatory): Secondary | ICD-10-CM

## 2019-12-19 DIAGNOSIS — I313 Pericardial effusion (noninflammatory): Secondary | ICD-10-CM | POA: Insufficient documentation

## 2019-12-19 DIAGNOSIS — I517 Cardiomegaly: Secondary | ICD-10-CM | POA: Insufficient documentation

## 2019-12-19 LAB — ECHOCARDIOGRAM COMPLETE
AR max vel: 3.85 cm2
AV Area VTI: 3.9 cm2
AV Area mean vel: 3.48 cm2
AV Mean grad: 4.5 mmHg
AV Peak grad: 8.1 mmHg
Ao pk vel: 1.43 m/s
Area-P 1/2: 5.34 cm2
S' Lateral: 3.12 cm

## 2019-12-19 NOTE — Progress Notes (Signed)
*  PRELIMINARY RESULTS* Echocardiogram 2D Echocardiogram has been performed.  Dustin Mcintyre 12/19/2019, 11:29 AM

## 2019-12-20 ENCOUNTER — Other Ambulatory Visit: Payer: Self-pay

## 2019-12-20 ENCOUNTER — Telehealth: Payer: Self-pay | Admitting: Gastroenterology

## 2019-12-20 DIAGNOSIS — K5732 Diverticulitis of large intestine without perforation or abscess without bleeding: Secondary | ICD-10-CM

## 2019-12-20 NOTE — Telephone Encounter (Signed)
Spoke to wife and they really want to see a Careers adviser.

## 2019-12-20 NOTE — Telephone Encounter (Signed)
Per Dr. Servando Snare refer to Dr. Everlene Farrier and he does not need appointment on 01/16/2020. Referral has been placed and canceled appointment

## 2019-12-20 NOTE — Telephone Encounter (Signed)
Patient wife calling stating pt would like to discuss some kind of surgery for diverticulitis with Dr. Servando Snare.

## 2019-12-26 ENCOUNTER — Other Ambulatory Visit: Payer: Self-pay

## 2019-12-26 ENCOUNTER — Ambulatory Visit (INDEPENDENT_AMBULATORY_CARE_PROVIDER_SITE_OTHER): Payer: PRIVATE HEALTH INSURANCE | Admitting: Cardiology

## 2019-12-26 ENCOUNTER — Encounter: Payer: Self-pay | Admitting: Cardiology

## 2019-12-26 VITALS — BP 126/84 | HR 99 | Ht 68.0 in | Wt 323.5 lb

## 2019-12-26 DIAGNOSIS — R0602 Shortness of breath: Secondary | ICD-10-CM

## 2019-12-26 DIAGNOSIS — E7800 Pure hypercholesterolemia, unspecified: Secondary | ICD-10-CM | POA: Diagnosis not present

## 2019-12-26 DIAGNOSIS — I3139 Other pericardial effusion (noninflammatory): Secondary | ICD-10-CM

## 2019-12-26 DIAGNOSIS — I313 Pericardial effusion (noninflammatory): Secondary | ICD-10-CM | POA: Diagnosis not present

## 2019-12-26 NOTE — Patient Instructions (Signed)
Medication Instructions:  Your physician recommends that you continue on your current medications as directed. Please refer to the Current Medication list given to you today.  *If you need a refill on your cardiac medications before your next appointment, please call your pharmacy*   Lab Work: Your physician recommends that you return for lab work in: at your earliest convenience to have cholesterol checked.  - You will need to be fasting. Please do not have anything to eat or drink after midnight the morning you have the lab work. You may only have water or black coffee with no cream or sugar. - Please go to the [facility]. You will check in at the front desk to the right as you walk into the atrium. Valet Parking is offered if needed. - No appointment needed. You may go any day between 7 am and 6 pm.  If you have labs (blood work) drawn today and your tests are completely normal, you will receive your results only by: Marland Kitchen MyChart Message (if you have MyChart) OR . A paper copy in the mail If you have any lab test that is abnormal or we need to change your treatment, we will call you to review the results.   Testing/Procedures: Your physician has requested that you have an LIMITED echocardiogram IN 6 MONTHS FROM NOW. Echocardiography is a painless test that uses sound waves to create images of your heart. It provides your doctor with information about the size and shape of your heart and how well your heart's chambers and valves are working. This procedure takes approximately one hour. There are no restrictions for this procedure. You may get an IV, if needed, to receive an ultrasound enhancing agent through to better visualize your heart.   Follow-Up: At [facility], you and your health needs are our priority.  As part of our continuing mission to provide you with exceptional heart care, we have created designated Provider Care Teams.  These Care Teams include your primary  Cardiologist (physician) and Advanced Practice Providers (APPs -  Physician Assistants and Nurse Practitioners) who all work together to provide you with the care you need, when you need it.  We recommend signing up for the patient portal called "MyChart".  Sign up information is provided on this After Visit Summary.  MyChart is used to connect with patients for Virtual Visits (Telemedicine).  Patients are able to view lab/test results, encounter notes, upcoming appointments, etc.  Non-urgent messages can be sent to your provider as well.   To learn more about what you can do with MyChart, go to [email or web address].    Your next appointment:   6 month(s) AFTER LIMITED ECHO.   The format for your next appointment:   In Person  Provider:   You may see Debbe Odea, MD or one of the following Advanced Practice Providers on your designated Care Team:    Nicolasa Ducking, NP  Eula Listen, PA-C  Marisue Ivan, PA-C  Cadence Lockwood, [place]

## 2019-12-26 NOTE — Progress Notes (Signed)
Cardiology Office Note:    Date:  12/26/2019   ID:  Dustin Mcintyre, DOB January 06, 1966, MRN [id number]  PCP:  Marval Regal, NP  [facility] Cardiologist:  Kate Sable, MD  [place] Electrophysiologist:  None   Referring MD: Marval Regal, NP   Chief Complaint  Patient presents with  . other    F/u echo no complaints today. Meds reviewed verbally with pt.    History of Present Illness:    Dustin Mcintyre is a 54 y.o. male with a hx of diabetes, GERD, obesity, pericardial effusion who presents for follow-up. Deviously seen for shortness of breath and pericardial effusion found on abdominal CT. Echocardiogram was ordered to evaluate severity of effusion. Started on torsemide.   Prior notes  patient had an abdominal CT on 11/25/2019 in the ED after presenting due to left lower quadrant abdominal pain and bloating.  He was diagnosed with acute diverticulitis.  Started on antibiotics. CT scan did reveal pericardial effusion measuring up to 2.3 cm in depth anteriorly.  A follow-up chest x-ray on 12/05/2019 was read as showing persistent cardiomegaly, likely related to pericardial effusion.  CT scan on 11/21/2019 obtained in the ED when patient presented for chest pain.  Elevated D-dimers was noted, CTA was negative for PE.  Dilated ascending aorta measuring 4.3 cm was noted.  Symptoms were deemed secondary to reflux, patient started on Protonix and Carafate. Protonix and Carafate have help with patient's symptoms.   Past Medical History:  Diagnosis Date  . Diverticulitis   . Hay fever     History reviewed. No pertinent surgical history.  Current Medications: Current Meds  Medication Sig  . amoxicillin-clavulanate (AUGMENTIN) 875-125 MG tablet Take 1 tablet by mouth 2 (two) times daily.  . Ascorbic Acid (VITAMIN C) 1000 MG tablet Take 1,000 mg by mouth daily.  . blood glucose meter kit and supplies Dispense based on patient and insurance preference. Use up to four  times daily as directed. (FOR ICD-10 E10.9, E11.9).  Marland Kitchen Boswellia-Glucosamine-Vit D (OSTEO BI-FLEX ONE PER DAY PO) Take by mouth.  . fexofenadine (ALLEGRA) 180 MG tablet Take 180 mg by mouth daily.  . Garlic 5093 MG CAPS Take 1,000 mg by mouth.  Marland Kitchen glucose blood test strip Used to check blood sugars four times daily.  Marland Kitchen ibuprofen (ADVIL,MOTRIN) 800 MG tablet Take 800 mg by mouth every 8 (eight) hours as needed.  . Insulin Pen Needle (PEN NEEDLES 29GX1/2") 29G X 12MM MISC Use daily with victoza  . metFORMIN (GLUCOPHAGE) 1000 MG tablet Take 1 tablet (1,000 mg total) by mouth 2 (two) times daily with a meal.  . metroNIDAZOLE (FLAGYL) 500 MG tablet Take 1 tablet (500 mg total) by mouth 3 (three) times daily.  . Multiple Vitamin (MULTIVITAMIN) tablet Take 1 tablet by mouth daily.  . Omega-3 Fatty Acids (FISH OIL) 1000 MG CAPS Take 1,000 mg by mouth 2 (two) times daily.  Marland Kitchen omeprazole (PRILOSEC) 20 MG capsule Take 20 mg by mouth daily.   . potassium chloride (KLOR-CON) 10 MEQ tablet Take 1 tablet (10 mEq total) by mouth daily.  . sitaGLIPtin (JANUVIA) 100 MG tablet TAKE 1 TABLET(100 MG) BY MOUTH DAILY  . sucralfate (CARAFATE) 1 g tablet Take by mouth.  . torsemide (DEMADEX) 20 MG tablet Take 1 tablet (20 mg total) by mouth daily.     Allergies:   Patient has no known allergies.   Social History   Socioeconomic History  . Marital status: Married    Spouse  name: Not on file  . Number of children: Not on file  . Years of education: Not on file  . Highest education level: Not on file  Occupational History  . Not on file  Tobacco Use  . Smoking status: Never Smoker  . Smokeless tobacco: Current User    Types: Chew  Vaping Use  . Vaping Use: Never used  Substance and Sexual Activity  . Alcohol use: Yes  . Drug use: Never  . Sexual activity: Yes  Other Topics Concern  . Not on file  Social History Narrative  . Not on file   Social Determinants of Health   Financial Resource Strain:     . Difficulty of Paying Living Expenses: Not on file  Food Insecurity:   . Worried About Charity fundraiser in the Last Year: Not on file  . Ran Out of Food in the Last Year: Not on file  Transportation Needs:   . Lack of Transportation (Medical): Not on file  . Lack of Transportation (Non-Medical): Not on file  Physical Activity:   . Days of Exercise per Week: Not on file  . Minutes of Exercise per Session: Not on file  Stress:   . Feeling of Stress : Not on file  Social Connections:   . Frequency of Communication with Friends and Family: Not on file  . Frequency of Social Gatherings with Friends and Family: Not on file  . Attends Religious Services: Not on file  . Active Member of Clubs or Organizations: Not on file  . Attends Archivist Meetings: Not on file  . Marital Status: Not on file     Family History: The patient's family history includes Hypertension in his father and paternal uncle.  ROS:   Please see the history of present illness.     All other systems reviewed and are negative.  EKGs/Labs/Other Studies Reviewed:    The following studies were reviewed today:   EKG:  EKG is  ordered today.  The ekg ordered today demonstrates normal sinus rhythm.  Recent Labs: 07/25/2019: TSH 1.660 12/05/2019: ALT 22; B Natriuretic Peptide 97.7; BUN 8; Creatinine, Ser 0.55; Hemoglobin 11.7; Platelets 400; Potassium 3.7; Sodium 140  Recent Lipid Panel    Component Value Date/Time   CHOL 216 (H) 04/19/2018 0949   TRIG 173 (H) 04/19/2018 0949   HDL 48 04/19/2018 0949   CHOLHDL 4.5 04/19/2018 0949   LDLCALC 133 (H) 04/19/2018 0949     Risk Assessment/Calculations:       Physical Exam:    VS:  BP 126/84 (BP Location: Left Arm, Patient Position: Sitting, Cuff Size: Large)   Pulse 99   Ht $R'5\' 8"'sF$  (1.727 m)   Wt (!) 323 lb 8 oz (146.7 kg)   SpO2 96%   BMI 49.19 kg/m     Wt Readings from Last 3 Encounters:  12/26/19 (!) 323 lb 8 oz (146.7 kg)  12/09/19 (!)  331 lb (150.1 kg)  12/05/19 (!) 333 lb (151 kg)     GEN:  Well nourished, well developed in no acute distress HEENT: Normal NECK: No JVD; No carotid bruits LYMPHATICS: No lymphadenopathy CARDIAC: RRR, no murmurs, rubs, gallops RESPIRATORY:  Clear to auscultation without rales, wheezing or rhonchi  ABDOMEN: Soft, non-tender, distended MUSCULOSKELETAL:  No edema; No deformity  SKIN: Warm and dry NEUROLOGIC:  Alert and oriented x 3 PSYCHIATRIC:  Normal affect   ASSESSMENT:    1. Pericardial effusion   2. Shortness of breath  3. Pure hypercholesterolemia   4. Morbid obesity ([place])    PLAN:    In order of problems listed above:  1. History of pericardial effusion.  CT scan on 11/25/2019 showing large pericardial effusion measuring up to 2.3 cm anteriorly. Echocardiogram 12/2019 showed mild to moderate sized circumferential pericardial effusion, no evidence for tamponade.  Etiology unclear.  Differential includes viral illness, inflammatory condition as he is currently dealing with diverticulitis.  Plan for limited echocardiogram in 6 months after management of diverticulitis. 2. Patient with shortness of breath with activity.  Improved with torsemide.  Echo with normal systolic function.  Obesity, deconditioning likely contributing.  Diastolic dysfunction could not be evaluated on echocardiogram. 3. History of hyperlipidemia.  Last lipid panel over a year and a half ago.  Obtain repeat fasting lipid profile.  Plan to start statin if patient is in statin benefit group. 4. Patient is morbidly obese.  Low-calorie diet, weight loss advised.  Follow-up in 6 months after limited echo.  Total encounter time 40 minutes  Greater than 50% was spent in counseling and coordination of care with the patient   Medication Adjustments/Labs and Tests Ordered: Current medicines are reviewed at length with the patient today.  Concerns regarding medicines are outlined above.  Orders Placed This  Encounter  Procedures  . Lipid panel  . EKG 12-Lead  . ECHOCARDIOGRAM LIMITED   No orders of the defined types were placed in this encounter.   Patient Instructions  Medication Instructions:  Your physician recommends that you continue on your current medications as directed. Please refer to the Current Medication list given to you today.  *If you need a refill on your cardiac medications before your next appointment, please call your pharmacy*   Lab Work: Your physician recommends that you return for lab work in: at your earliest convenience to have cholesterol checked.  - You will need to be fasting. Please do not have anything to eat or drink after midnight the morning you have the lab work. You may only have water or black coffee with no cream or sugar. - Please go to the [facility]. You will check in at the front desk to the right as you walk into the atrium. Valet Parking is offered if needed. - No appointment needed. You may go any day between 7 am and 6 pm.  If you have labs (blood work) drawn today and your tests are completely normal, you will receive your results only by: Marland Kitchen MyChart Message (if you have MyChart) OR . A paper copy in the mail If you have any lab test that is abnormal or we need to change your treatment, we will call you to review the results.   Testing/Procedures: Your physician has requested that you have an LIMITED echocardiogram IN 6 MONTHS FROM NOW. Echocardiography is a painless test that uses sound waves to create images of your heart. It provides your doctor with information about the size and shape of your heart and how well your heart's chambers and valves are working. This procedure takes approximately one hour. There are no restrictions for this procedure. You may get an IV, if needed, to receive an ultrasound enhancing agent through to better visualize your heart.   Follow-Up: At [facility], you and your health needs are our priority.   As part of our continuing mission to provide you with exceptional heart care, we have created designated Provider Care Teams.  These Care Teams include your primary Cardiologist (physician) and  Advanced Practice Providers (APPs -  Physician Assistants and Nurse Practitioners) who all work together to provide you with the care you need, when you need it.  We recommend signing up for the patient portal called "MyChart".  Sign up information is provided on this After Visit Summary.  MyChart is used to connect with patients for Virtual Visits (Telemedicine).  Patients are able to view lab/test results, encounter notes, upcoming appointments, etc.  Non-urgent messages can be sent to your provider as well.   To learn more about what you can do with MyChart, go to [email or web address].    Your next appointment:   6 month(s) AFTER LIMITED ECHO.   The format for your next appointment:   In Person  Provider:   You may see Kate Sable, MD or one of the following Advanced Practice Providers on your designated Care Team:    Murray Hodgkins, NP  Christell Faith, PA-C  Marrianne Mood, PA-C  Cadence Deercroft, [place]      Signed, Kate Sable, MD  12/26/2019 12:32 PM    [place]

## 2020-01-02 ENCOUNTER — Encounter: Payer: Self-pay | Admitting: Surgery

## 2020-01-02 ENCOUNTER — Ambulatory Visit (INDEPENDENT_AMBULATORY_CARE_PROVIDER_SITE_OTHER): Payer: BC Managed Care – PPO | Admitting: Surgery

## 2020-01-02 ENCOUNTER — Other Ambulatory Visit: Payer: Self-pay

## 2020-01-02 ENCOUNTER — Other Ambulatory Visit
Admission: RE | Admit: 2020-01-02 | Discharge: 2020-01-02 | Disposition: A | Discharge status: Home / Self Care | Payer: BC Managed Care – PPO | Attending: Cardiology | Admitting: Cardiology

## 2020-01-02 VITALS — BP 127/84 | HR 105 | Temp 98.3°F | Ht 68.0 in | Wt 316.0 lb

## 2020-01-02 DIAGNOSIS — K5732 Diverticulitis of large intestine without perforation or abscess without bleeding: Secondary | ICD-10-CM | POA: Diagnosis not present

## 2020-01-02 DIAGNOSIS — E7800 Pure hypercholesterolemia, unspecified: Secondary | ICD-10-CM | POA: Diagnosis not present

## 2020-01-02 LAB — LIPID PANEL
Cholesterol: 175 mg/dL (ref 0–200)
HDL: 35 mg/dL — ABNORMAL LOW (ref >40)
LDL Cholesterol: 124 mg/dL — ABNORMAL HIGH (ref 0–99)
Total CHOL/HDL Ratio: 5 RATIO
Triglycerides: 81 mg/dL (ref <150)
VLDL: 16 mg/dL (ref 0–40)

## 2020-01-02 MED ORDER — POLYETHYLENE GLYCOL 3350 17 GM/SCOOP PO POWD: ORAL | 0 refills | Status: DC

## 2020-01-02 MED ORDER — BISACODYL 5 MG PO TBEC: DELAYED_RELEASE_TABLET | ORAL | 0 refills | Status: DC

## 2020-01-02 MED ORDER — AMOXICILLIN-POT CLAVULANATE 875-125 MG PO TABS: 1.0000 | ORAL_TABLET | Freq: Two times a day (BID) | ORAL | 0 refills | Status: DC

## 2020-01-02 MED ORDER — METRONIDAZOLE 500 MG PO TABS: 500.0000 mg | ORAL_TABLET | Freq: Three times a day (TID) | ORAL | 0 refills | Status: DC

## 2020-01-02 NOTE — Patient Instructions (Addendum)
You may want to try eating several small meals a day.  We can refer you to a dietary provider to look at non surgical weight loss. You may call to schedule this appointment. See paperwork provided.   We get you set up for Barium Enema at [facility]. You are scheduled for a Barium Enema at [facility] on Monday November 15th at 9:00 am. Please arrive there by 8:30 am. You will have nothing to eat or drink after midnight the night prior. You will need to complete a bowel prep the day prior. See you form for information on this.   We will have you follow up here in 6 weeks.   We have sent in new prescriptions for Augmentin and Flagyl to prevent any recurrence of diverticulitis prior to surgery.

## 2020-01-02 NOTE — Progress Notes (Signed)
Patient ID: Dustin Mcintyre, male   DOB: Dec 20, 1965, 54 y.o.   MRN: [id number]  HPI Dustin Mcintyre is a 54 y.o. male seen in consultation at the request of Dr.Wohl for chronic and recurrent diverticulitis.  He stated that his first episode of diverticulitis was when he was in his late 53s.  He usually had 1 or 2 episodes a year but so far this year he has had 3.  He has been on antibiotics for the last 2 months.  He experiences left lower quadrant and left upper quadrant intermittent dull pain that is moderate intensity.  At some point time he had associated dyspnea on exertion.  No fevers no chills.  Only antibiotic that works for him is Augmentin.  He has been in Augmentin for the last 2 months.  He also had a CT scan that I have personally reviewed showing evidence of diverticulosis and active diverticulitis in the sigmoid colon and also the splenic flexure.  No evidence of an abscess or perforation.  He also had significant large liver. CMP was completely normal see show only slight increase in the white count and mild anemia. He has been seen at [facility] and cardiac work-up performed and is otherwise negative according to the patient. Recently he saw cardiology to a paracardial effusion with no evidence of tamponade.  He has been diuresed with significant improvement of his dyspnea on exertion. He also reports that his father had a history of recurrent diverticulitis and actually had a sigmoid colectomy with significant improvement    HPI  Past Medical History:  Diagnosis Date  . Diverticulitis   . Hay fever     Medical Hx  DM Diverticulitis Morbid obesity    Family History  Problem Relation Age of Onset  . Hypertension Father   . Hypertension Paternal Uncle     Social History Social History   Tobacco Use  . Smoking status: Never Smoker  . Smokeless tobacco: Current User    Types: Chew  Vaping Use  . Vaping Use: Never used  Substance Use Topics  . Alcohol use: Yes  .  Drug use: Never    No Known Allergies  Current Outpatient Medications  Medication Sig Dispense Refill  . amoxicillin-clavulanate (AUGMENTIN) 875-125 MG tablet Take 1 tablet by mouth 2 (two) times daily. 28 tablet 0  . Ascorbic Acid (VITAMIN C) 1000 MG tablet Take 1,000 mg by mouth daily.    . blood glucose meter kit and supplies Dispense based on patient and insurance preference. Use up to four times daily as directed. (FOR ICD-10 E10.9, E11.9). 1 each 0  . Boswellia-Glucosamine-Vit D (OSTEO BI-FLEX ONE PER DAY PO) Take by mouth.    . fexofenadine (ALLEGRA) 180 MG tablet Take 180 mg by mouth daily.    . Garlic 5277 MG CAPS Take 1,000 mg by mouth.    Marland Kitchen glucose blood test strip Used to check blood sugars four times daily. 200 each 12  . ibuprofen (ADVIL,MOTRIN) 800 MG tablet Take 800 mg by mouth every 8 (eight) hours as needed.    . Insulin Pen Needle (PEN NEEDLES 29GX1/2") 29G X 12MM MISC Use daily with victoza 100 each 2  . metFORMIN (GLUCOPHAGE) 1000 MG tablet Take 1 tablet (1,000 mg total) by mouth 2 (two) times daily with a meal. 180 tablet 1  . metroNIDAZOLE (FLAGYL) 500 MG tablet Take 1 tablet (500 mg total) by mouth 3 (three) times daily. 42 tablet 0  . Multiple Vitamin (MULTIVITAMIN) tablet Take 1  tablet by mouth daily.    . Omega-3 Fatty Acids (FISH OIL) 1000 MG CAPS Take 1,000 mg by mouth 2 (two) times daily.    Marland Kitchen omeprazole (PRILOSEC) 20 MG capsule Take 20 mg by mouth daily.     . potassium chloride (KLOR-CON) 10 MEQ tablet Take 1 tablet (10 mEq total) by mouth daily. 30 tablet 5  . sitaGLIPtin (JANUVIA) 100 MG tablet TAKE 1 TABLET(100 MG) BY MOUTH DAILY 90 tablet 1  . sucralfate (CARAFATE) 1 g tablet Take by mouth.    . torsemide (DEMADEX) 20 MG tablet Take 1 tablet (20 mg total) by mouth daily. 30 tablet 5  . amoxicillin-clavulanate (AUGMENTIN) 875-125 MG tablet Take 1 tablet by mouth 2 (two) times daily for 14 days. 24 tablet 0  . bisacodyl (DULCOLAX) 5 MG EC tablet Take 2  tablets at 8 am the day prior to your scan Take the other 2 tablets at 10 am the day prior to your scan 4 tablet 0  . metroNIDAZOLE (FLAGYL) 500 MG tablet Take 1 tablet (500 mg total) by mouth 3 (three) times daily for 14 days. 42 tablet 0  . polyethylene glycol powder (MIRALAX) 17 GM/SCOOP powder Mix full container in 64 ounces of Gatorade or other clear liquid. NO Red or Purple 238 g 0   No current facility-administered medications for this visit.     Review of Systems Full ROS  was asked and was negative except for the information on the HPI  Physical Exam Blood pressure 127/84, pulse (!) 105, temperature 98.3 F (36.8 C), height 5' 8"  (1.727 m), weight (!) 316 lb (143.3 kg), SpO2 93 %. CONSTITUTIONAL: Patient in no acute distress with a BMI of 49. EYES: Pupils are equal, round, and reactive to light, Sclera are non-icteric. EARS, NOSE, MOUTH AND THROAT: He is wearing a mask hearing is intact to voice. LYMPH NODES:  Lymph nodes in the neck are normal. RESPIRATORY:  Lungs are clear. There is normal respiratory effort, with equal breath sounds bilaterally, and without pathologic use of accessory muscles. CARDIOVASCULAR: Heart is regular without murmurs, gallops, or rubs. GI: The abdomen is  soft, mild tender to palpation in the left upper quadrant and left lower quadrant without peritonitis and nondistended. There are no palpable masses. There is no hepatosplenomegaly. There are normal bowel sounds in all quadrants. GU: Rectal deferred.   MUSCULOSKELETAL: Normal muscle strength and tone. No cyanosis or edema.   SKIN: Turgor is good and there are no pathologic skin lesions or ulcers. NEUROLOGIC: Motor and sensation is grossly normal. Cranial nerves are grossly intact. PSYCH:  Oriented to person, place and time. Affect is normal.  Data Reviewed  I have personally reviewed the patient's imaging, laboratory findings and medical records.    Assessment/Plan 54 year old male with a BMI of  49 presents with recalcitrant and recurrent diverticulitis, with chronic antibiotic therapy.  Discussed with the patient in detail about my thought process regarding his diverticulitis.  Given the recalcitrant nature and multiple recurrences as well as interference with daily life I do recommend elective partial colectomy.  Given that he has 2 areas 1 in the sigmoid colon and 1 in the splenic flexure this will involve more than a sigmoid colectomy and will have to do a left colectomy as well.  This is by no means Hartman's procedure especially in a patient with a BMI of 49.  I had extensive discussion with patient regarding medical optimization to include reasonable BMI.  He is motivated to  get there.  There is no need for any urgent surgical intervention at this time. We will place referral for weight management physician, I will also obtain barium enema and will do a prescription for Augmentin for 2 weeks.  This seems to be the only antibiotics at one point.  A copy of this report was sent to the referring provider      Caroleen Hamman, MD FACS General Surgeon 01/02/2020, 1:46 PM

## 2020-01-09 ENCOUNTER — Telehealth (INDEPENDENT_AMBULATORY_CARE_PROVIDER_SITE_OTHER): Payer: BC Managed Care – PPO | Admitting: Nurse Practitioner

## 2020-01-09 ENCOUNTER — Ambulatory Visit (INDEPENDENT_AMBULATORY_CARE_PROVIDER_SITE_OTHER): Payer: BC Managed Care – PPO

## 2020-01-09 ENCOUNTER — Encounter: Payer: Self-pay | Admitting: Nurse Practitioner

## 2020-01-09 ENCOUNTER — Other Ambulatory Visit: Payer: Self-pay

## 2020-01-09 DIAGNOSIS — E1169 Type 2 diabetes mellitus with other specified complication: Secondary | ICD-10-CM | POA: Diagnosis not present

## 2020-01-09 DIAGNOSIS — I83811 Varicose veins of right lower extremities with pain: Secondary | ICD-10-CM

## 2020-01-09 DIAGNOSIS — K760 Fatty (change of) liver, not elsewhere classified: Secondary | ICD-10-CM

## 2020-01-09 DIAGNOSIS — K5720 Diverticulitis of large intestine with perforation and abscess without bleeding: Secondary | ICD-10-CM | POA: Diagnosis not present

## 2020-01-09 DIAGNOSIS — F17220 Nicotine dependence, chewing tobacco, uncomplicated: Secondary | ICD-10-CM

## 2020-01-09 NOTE — Progress Notes (Addendum)
Virtual Visit via Virtual Note  This visit type was conducted due to national recommendations for restrictions regarding the COVID-19 pandemic (e.g. social distancing).  This format is felt to be most appropriate for this patient at this time.  All issues noted in this document were discussed and addressed.  No physical exam was performed (except for noted visual exam findings with Video Visits).   I connected with@ on 01/09/20 at  1:30 PM EST by a video enabled telemedicine application or telephone and verified that I am speaking with the correct person using two identifiers. Location patient: home Location provider: work or home office Persons participating in the virtual visit: patient, provider  I discussed the limitations, risks, security and privacy concerns of performing an evaluation and management service by telephone and the availability of in person appointments. I also discussed with the patient that there may be a patient responsible charge related to this service. The patient expressed understanding and agreed to proceed.  Reason for visit: Follow up on diverticulitis and he has surgery questions.  HPI: This 54 yo with diabetes mellitus, morbid obesity, hyperlipidemia, pericardial effusion without tamponade improved with diuresis, Aorto bi-iliac atherosclerosis, chronic sinusitis, varicose veins bilateral extremities, and chronic, recurrent diverticulitis with impending colectomy, chewing tobacco.    Chronic recurrent  diverticulitis in sigmoid colon and splenic flexure: Followed by Dr. Dahlia Byes in general surgery and is on extended course of Augmentin over the last 2 months.He is having less pain in the left upper and  lower quadrants since he has been on Augmentin. He take Ibuprofen 200 mg daily. He was taking 800 mg daily. He has been taking a probiotic daily.  No diarrhea, in fact has constipation sometimes for a few days and has to take Miralax. A colonoscopy is planned for next week  by Dr. Allen Norris.  There are plans for colectomy in January.  The patient has been advised to try to lose weight before the surgery.   Lab Results  Component Value Date   WBC 12.7 (H) 12/05/2019   HGB 11.7 (L) 12/05/2019   HCT 35.0 (L) 12/05/2019   MCV 86.4 12/05/2019   PLT 400 12/05/2019   Morbid Obesity/BMI 48:  He would like referral to a nutritionist.  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  With his diverticulitis, he has to eat smaller more frequent meals.  If he eats a large meal he gets reflux/regurgitation.  A meal recall for him: On the road with a cooler in his truck and a stop for breakfast can be scrambled eggs and sometimes a piece of bacon or sausage.  Lunch is a club sandwich with ham lettuce and tomato, rolled lunch meat or cottage cheese for supper and that is a full days meal. He drinks 5-6 twenty oz bottles of water daily.  He does not understand why he is not losing more weight.  TSH 1.66.   T2DM:  A1c 6.4: CR 0. 55. Fasting blood sugars 100-130.  Checks his blood sugars 2-3 times before meals.  He is taking Metformin 1000 mg twice daily.  He is on Januvia 100 mg once daily.  He been off these medications when he was sick with diverticulitis (x2 months) and has been taking it again over the last 2 weeks. He is in favor of injectable medication if recommended. Not sure if I would recommend GLP1 with his GI issues.   Lab Results  Component Value  Date   LABMICR 15.5 07/25/2019   Lab Results  Component Value Date   HGBA1C 6.4 (H) 07/25/2019    ASCVD/ History of pericardial effusion/Chewing tobacco: Followed by Cardiology. Echocardiogram December 21, 2019 showed mild to moderate sized circumferential pericardial effusion no evidence of tamponade.  Normal systolic function.  Etiology unclear.  Plan for limited echocardiogram in 6 months after management of diverticulitis.Aorto bi-iliac atherosclerosis on CT abdomen scan. His 10 year risk  for ASCVD is 12%.   Enlarged liver and Hepatic steatosis/HLD: He also is aware of his elevated cholesterol level and wants to know if he should be on a statin He was seen by Dr. Mylo Red- Charlestine Night in Cardiology who recommended low-cholesterol diet.  Patient is serious about weight loss.  Since he has been dealing with diverticulitis he has lost weight from 350 pounds to 317 pounds. Normal LFTs.   Lab Results  Component Value Date   CHOL 175 01/02/2020   HDL 35 (L) 01/02/2020   LDLCALC 124 (H) 01/02/2020   TRIG 81 01/02/2020   CHOLHDL 5.0 01/02/2020      Chemistry      Component Value Date/Time   NA 140 12/05/2019 1021   NA 141 07/25/2019 1201   NA 143 12/08/2013 1955   K 3.7 12/05/2019 1021   K 4.1 12/08/2013 1955   CL 104 12/05/2019 1021   CL 107 12/08/2013 1955   CO2 25 12/05/2019 1021   CO2 30 12/08/2013 1955   BUN 8 12/05/2019 1021   BUN 13 07/25/2019 1201   BUN 14 12/08/2013 1955   CREATININE 0.55 (L) 12/05/2019 1021   CREATININE 0.77 12/08/2013 1955      Component Value Date/Time   CALCIUM 9.1 12/05/2019 1021   CALCIUM 8.4 (L) 12/08/2013 1955   ALKPHOS 74 12/05/2019 1021   AST 16 12/05/2019 1021   ALT 22 12/05/2019 1021   BILITOT 0.8 12/05/2019 1021   BILITOT <0.2 07/25/2019 1201     ROS: See pertinent positives and negatives per HPI.  Past Medical History:  Diagnosis Date  . Diverticulitis   . Hay fever     No past surgical history on file.  Family History  Problem Relation Age of Onset  . Hypertension Father   . Hypertension Paternal Uncle     SOCIAL HX: Chewing tobacco and social alcohol    Current Outpatient Medications:  .  amoxicillin-clavulanate (AUGMENTIN) 875-125 MG tablet, Take 1 tablet by mouth 2 (two) times daily for 14 days., Disp: 24 tablet, Rfl: 0 .  Ascorbic Acid (VITAMIN C) 1000 MG tablet, Take 1,000 mg by mouth daily., Disp: , Rfl:  .  bisacodyl (DULCOLAX) 5 MG EC tablet, Take 2 tablets at 8 am the day prior to your scan Take the other  2 tablets at 10 am the day prior to your scan, Disp: 4 tablet, Rfl: 0 .  blood glucose meter kit and supplies, Dispense based on patient and insurance preference. Use up to four times daily as directed. (FOR ICD-10 E10.9, E11.9)., Disp: 1 each, Rfl: 0 .  Boswellia-Glucosamine-Vit D (OSTEO BI-FLEX ONE PER DAY PO), Take by mouth., Disp: , Rfl:  .  fexofenadine (ALLEGRA) 180 MG tablet, Take 180 mg by mouth daily., Disp: , Rfl:  .  Garlic 1610 MG CAPS, Take 1,000 mg by mouth., Disp: , Rfl:  .  glucose blood test strip, Used to check blood sugars four times daily., Disp: 200 each, Rfl: 12 .  ibuprofen (ADVIL,MOTRIN) 800 MG tablet, Take 800  mg by mouth every 8 (eight) hours as needed., Disp: , Rfl:  .  Insulin Pen Needle (PEN NEEDLES 29GX1/2") 29G X 12MM MISC, Use daily with victoza, Disp: 100 each, Rfl: 2 .  metFORMIN (GLUCOPHAGE) 1000 MG tablet, Take 1 tablet (1,000 mg total) by mouth 2 (two) times daily with a meal., Disp: 180 tablet, Rfl: 1 .  metroNIDAZOLE (FLAGYL) 500 MG tablet, Take 1 tablet (500 mg total) by mouth 3 (three) times daily for 14 days., Disp: 42 tablet, Rfl: 0 .  Multiple Vitamin (MULTIVITAMIN) tablet, Take 1 tablet by mouth daily., Disp: , Rfl:  .  Omega-3 Fatty Acids (FISH OIL) 1000 MG CAPS, Take 1,000 mg by mouth 2 (two) times daily., Disp: , Rfl:  .  omeprazole (PRILOSEC) 20 MG capsule, Take 20 mg by mouth daily. , Disp: , Rfl:  .  polyethylene glycol powder (MIRALAX) 17 GM/SCOOP powder, Mix full container in 64 ounces of Gatorade or other clear liquid. NO Red or Purple, Disp: 238 g, Rfl: 0 .  potassium chloride (KLOR-CON) 10 MEQ tablet, Take 1 tablet (10 mEq total) by mouth daily., Disp: 30 tablet, Rfl: 5 .  Probiotic Product (PROBIOTIC ADVANCED PO), Take by mouth., Disp: , Rfl:  .  sitaGLIPtin (JANUVIA) 100 MG tablet, TAKE 1 TABLET(100 MG) BY MOUTH DAILY, Disp: 90 tablet, Rfl: 1 .  sucralfate (CARAFATE) 1 g tablet, Take by mouth., Disp: , Rfl:  .  torsemide (DEMADEX) 20 MG  tablet, Take 1 tablet (20 mg total) by mouth daily., Disp: 30 tablet, Rfl: 5  EXAM:  VITALS per patient if applicable: Wt 317  GENERAL: alert, oriented, appears well and in no acute distress  HEENT: atraumatic, conjunctiva clear, no obvious abnormalities on inspection of external nose and ears  NECK: normal movements of the head and neck  LUNGS: on inspection no signs of respiratory distress, breathing rate appears normal, no obvious gross SOB, gasping or wheezing  CV: no obvious cyanosis  MS: moves all visible extremities without noticeable abnormality  PSYCH/NEURO: pleasant and cooperative, no obvious depression or anxiety, speech and thought processing grossly intact  ASSESSMENT AND PLAN:  Discussed the following assessment and plan:  Morbid obesity ([place]) - Plan: Referral to Chronic Care Management Services, Amb Ref to Medical Weight Management, B12 and Folate Panel, VITAMIN D 25 Hydroxy (Vit-D Deficiency, Fractures)  Hepatic steatosis - Plan: Referral to Chronic Care Management Services, Amb Ref to Medical Weight Management  Diverticulitis of large intestine with perforation without abscess or bleeding - Plan: Referral to Chronic Care Management Services, Amb Ref to Medical Weight Management, CBC with Differential/Platelet  Type 2 diabetes mellitus with other specified complication, without long-term current use of insulin ([place]) - Plan: Referral to Chronic Care Management Services, Amb Ref to Medical Weight Management, Hemoglobin A1c, Comp Met (CMET)   I placed referral in to Chronic Care Management for the assist with the clinical pharmacist for DM management.  I placed referral in to the [facility] monitored team- to help you get closer to your wt loss goals before surgery while working with a diet safe for active diverticulitis.   Please come in for lab appt so we know where you are with your diabetes is now.   Continue to follow up with GI about your  diverticulitis.   I discussed the assessment and treatment plan with the patient. The patient was provided an opportunity to ask questions and all were answered. The patient agreed with the plan and demonstrated an  understanding of the instructions.   The patient was advised to call back or seek an in-person evaluation if the symptoms worsen or if the condition fails to improve as anticipated.  Addendum: I checked with Dr. Mylo Red- Charlestine Night and since his 10 yr ASCVD risk is 12 % will start ASA 81 mg and Lipitor 40 mg daily. Also, Chantix generic is available for nicotine -chewing tobacco cessation if he is interested.    Denice Paradise, NP Adult Nurse Practitioner [facility] [place] [phone]

## 2020-01-09 NOTE — Patient Instructions (Addendum)
I placed referral in to Chronic Care Management for the assist with the clinical pharmacist for DM management.  I placed referral in to the [facility] monitored team- to help you get closer to your wt loss goals before surgery while working with a diet safe for active diverticulitis.   Please come in for lab appt so we know where you are with your diabetes is now.   Continue to follow up with GI about your diverticulitis.

## 2020-01-10 DIAGNOSIS — K760 Fatty (change of) liver, not elsewhere classified: Secondary | ICD-10-CM | POA: Insufficient documentation

## 2020-01-10 DIAGNOSIS — K5720 Diverticulitis of large intestine with perforation and abscess without bleeding: Secondary | ICD-10-CM | POA: Insufficient documentation

## 2020-01-11 ENCOUNTER — Telehealth: Payer: Self-pay

## 2020-01-11 ENCOUNTER — Telehealth: Payer: Self-pay | Admitting: Nurse Practitioner

## 2020-01-11 NOTE — Telephone Encounter (Signed)
Pt's wife Porfirio Mylar called and states that pt does not want Lipitor or anything that has the ingredients of lipitor. Pt states that family members have not had good experiences with that drug and it makes their arms hurt. Please advise. Pt 's wife states that pt is done with work for the day and is lying down so please call her cell @ [phone]

## 2020-01-11 NOTE — Telephone Encounter (Signed)
I checked with Dr. Myriam Forehand- Sandie Ano  I recommend starting  ASA 81 mg and Lipitor 40 mg daily- if you agree.   Also, Chantix generic is available for nicotine -chewing tobacco cessation if you are interested in stopping that before your surgery.Let me know what you think about these suggestions and I will place orders.

## 2020-01-11 NOTE — Chronic Care Management (AMB) (Signed)
  Care Management   Outreach Note  01/11/2020 Name: Dustin Mcintyre MRN: [id number] DOB: 07-Dec-1965  Referred by: Theadore Nan, NP Reason for referral : Care Coordination (Outreach to schedule referral for Pharm D )   An unsuccessful telephone outreach was attempted today. The patient was referred to the case management team for assistance with care management and care coordination.   Follow Up Plan: A HIPAA compliant phone message was left for the patient providing contact information and requesting a return call.  The care management team will reach out to the patient again over the next 7 days.  If patient returns call to provider office, please advise to call Embedded Care Management Care Guide Penne Lash  at [phone]  Penne Lash, RMA Care Guide, Embedded Care Coordination [facility]  [place], [place] [address] Direct Dial: [phone] [email or web address] Website: [email or web address]

## 2020-01-12 ENCOUNTER — Telehealth: Payer: Self-pay | Admitting: Surgery

## 2020-01-12 ENCOUNTER — Other Ambulatory Visit: Payer: Self-pay

## 2020-01-12 MED ORDER — ASPIRIN EC 81 MG PO TBEC: 81.0000 mg | DELAYED_RELEASE_TABLET | Freq: Every day | ORAL | 11 refills | Status: AC

## 2020-01-12 MED ORDER — METRONIDAZOLE 500 MG PO TABS: 500.0000 mg | ORAL_TABLET | Freq: Three times a day (TID) | ORAL | 0 refills | Status: DC

## 2020-01-12 MED ORDER — AMOXICILLIN-POT CLAVULANATE 875-125 MG PO TABS: 1.0000 | ORAL_TABLET | Freq: Two times a day (BID) | ORAL | 0 refills | Status: DC

## 2020-01-12 MED ORDER — ROSUVASTATIN CALCIUM 10 MG PO TABS: 10.0000 mg | ORAL_TABLET | Freq: Every day | ORAL | 3 refills | Status: AC

## 2020-01-12 NOTE — Telephone Encounter (Signed)
Per Dr Everlene Farrier will refill his Augmentin and Flagyl one more time. He will need to ask GI or PCP about GERD medication. He may call the office if he starts to have symptoms of diverticulitis before being seen in January. He is currently working on reducing his weight.

## 2020-01-12 NOTE — Telephone Encounter (Signed)
Wife, Dustin Mcintyre calls for her husband.  She said that Dr. Everlene Farrier wanted her husband to remain on the Augmentin and Flagyl for his diverticulitis.  She states that he needs a refill on both of these.  Also they are asking if Dr. Everlene Farrier wants him to continue on Pantoprazole for his GERD, will need refill on this as well.  Please call her.  Thank you.

## 2020-01-12 NOTE — Chronic Care Management (AMB) (Signed)
  Care Management   Outreach Note  01/12/2020 Name: Dustin Mcintyre MRN: [id number] DOB: May 11, 1965  Referred by: Theadore Nan, NP Reason for referral : Care Coordination (Outreach to schedule referral for Pharm D ) and Care Coordination (Second Outreach to schedule referral for Pharm D )   A second unsuccessful telephone outreach was attempted today. The patient was referred to the case management team for assistance with care management and care coordination.   Follow Up Plan: A HIPAA compliant phone message was left for the patient providing contact information and requesting a return call.  The care management team will reach out to the patient again over the next 7 days.  If patient returns call to provider office, please advise to call Embedded Care Management Care Guide Penne Lash  at [phone]  Penne Lash, RMA Care Guide, Embedded Care Coordination [facility]  [place], [place] [address] Direct Dial: [phone] [email or web address]: [email or web address]

## 2020-01-12 NOTE — Telephone Encounter (Addendum)
Jessica: 1. Please call him and let him know he has ASA 81 mg and Crestor 10 mg daily at the pharmacy.   2. He will need lipid and  hepatic panel  checked in 8 weeks.   He will need lab appt.  3.  And f/up  Appt with OV with PCP to follow in 8 weeks. Thank you.

## 2020-01-12 NOTE — Addendum Note (Signed)
Addended by: Amedeo Kinsman A on: 01/12/2020 11:48 AM   Modules accepted: Orders

## 2020-01-12 NOTE — Telephone Encounter (Signed)
Patient aware rx sent to pharmacy and front staff will call to schedule his f/u appt

## 2020-01-12 NOTE — Chronic Care Management (AMB) (Signed)
  Care Management   Note  01/12/2020 Name: Dustin Mcintyre MRN: [id number] DOB: 09-05-1965  Dustin Mcintyre is a 54 y.o. year old male who is a primary care patient of Theadore Nan, NP. I reached out to [name] by phone today in response to a referral sent by Dustin Mcintyre's health plan.    [name] was given information about care management services today including:  1. Care management services include personalized support from designated clinical staff supervised by his physician, including individualized plan of care and coordination with other care providers 2. 24/7 contact phone numbers for assistance for urgent and routine care needs. 3. The patient may stop care management services at any time by phone call to the office staff.  Patient did not agree to enrollment in care management services and does not wish to consider at this time.  Follow up plan: Patient declines further follow up and engagement by the care management team. Appropriate care team members and provider have been notified via electronic communication.   Penne Lash, RMA Care Guide, Embedded Care Coordination [facility]  [place], [place] [address] Direct Dial: [phone] [email or web address] Website: [email or web address]

## 2020-01-16 ENCOUNTER — Ambulatory Visit: Payer: PRIVATE HEALTH INSURANCE | Admitting: Gastroenterology

## 2020-01-16 ENCOUNTER — Telehealth: Payer: Self-pay | Admitting: *Deleted

## 2020-01-16 ENCOUNTER — Other Ambulatory Visit: Payer: Self-pay

## 2020-01-16 ENCOUNTER — Ambulatory Visit
Admission: RE | Admit: 2020-01-16 | Discharge: 2020-01-16 | Disposition: A | Discharge status: Home / Self Care | Payer: BC Managed Care – PPO | Source: Ambulatory Visit | Attending: Surgery | Admitting: Surgery

## 2020-01-16 ENCOUNTER — Telehealth: Payer: Self-pay

## 2020-01-16 DIAGNOSIS — K5732 Diverticulitis of large intestine without perforation or abscess without bleeding: Secondary | ICD-10-CM | POA: Insufficient documentation

## 2020-01-16 DIAGNOSIS — K5730 Diverticulosis of large intestine without perforation or abscess without bleeding: Secondary | ICD-10-CM | POA: Diagnosis not present

## 2020-01-16 NOTE — Telephone Encounter (Signed)
Message left for the patient letting him know that per Dr Everlene Farrier nothing worrisome showed in his barium enema he had today, just diverticulosis. He will follow up here in January. He may call back with any questions.

## 2020-01-16 NOTE — Telephone Encounter (Signed)
Patient called back and is aware of his results from his BE. He is also aware he is in our recalls to follow up in January.

## 2020-01-17 ENCOUNTER — Encounter: Payer: Self-pay | Admitting: Nurse Practitioner

## 2020-01-23 ENCOUNTER — Ambulatory Visit: Payer: PRIVATE HEALTH INSURANCE | Admitting: Gastroenterology

## 2020-01-31 ENCOUNTER — Telehealth: Payer: Self-pay | Admitting: Nurse Practitioner

## 2020-01-31 DIAGNOSIS — R160 Hepatomegaly, not elsewhere classified: Secondary | ICD-10-CM

## 2020-01-31 DIAGNOSIS — Z0000 Encounter for general adult medical examination without abnormal findings: Secondary | ICD-10-CM

## 2020-01-31 DIAGNOSIS — E785 Hyperlipidemia, unspecified: Secondary | ICD-10-CM

## 2020-01-31 DIAGNOSIS — E1169 Type 2 diabetes mellitus with other specified complication: Secondary | ICD-10-CM

## 2020-01-31 DIAGNOSIS — K760 Fatty (change of) liver, not elsewhere classified: Secondary | ICD-10-CM

## 2020-01-31 DIAGNOSIS — R030 Elevated blood-pressure reading, without diagnosis of hypertension: Secondary | ICD-10-CM

## 2020-01-31 NOTE — Telephone Encounter (Signed)
I spoke to his wife today about plan of care. He established care with me in May and has only been seen by video visit since then. 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    I placed referral to CCM per his wife after I explained how Catie can help him manage his chronic health problems: DM, HLD, HTN, obesity, awaiting colectomy for chronic diverticulitis.  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   He used Ozempic in the past with Victoza and Metformin and had terrible hypoglycemia. Wife does not think he will want to try it again. He is currently on Metformin and Januvia- but has drastically changed his diet d/t GI issues, lost 16 lbs and counting and has been advised about high protein diet-100 gm per day with low calorie Premier protein and Ensure Max supplements. Last A1c 6.4. in May.

## 2020-02-01 ENCOUNTER — Telehealth: Payer: Self-pay | Admitting: Surgery

## 2020-02-01 DIAGNOSIS — K5732 Diverticulitis of large intestine without perforation or abscess without bleeding: Secondary | ICD-10-CM

## 2020-02-01 MED ORDER — AMOXICILLIN-POT CLAVULANATE 875-125 MG PO TABS: 1.0000 | ORAL_TABLET | Freq: Two times a day (BID) | ORAL | 0 refills | Status: AC

## 2020-02-01 MED ORDER — METRONIDAZOLE 500 MG PO TABS: 500.0000 mg | ORAL_TABLET | Freq: Three times a day (TID) | ORAL | 0 refills | Status: AC

## 2020-02-01 NOTE — Telephone Encounter (Signed)
[name] wife is calling and said her husband has had another flare up and is needing these medications refilled patient is needing amoxiclav 875 metrozol 500 and is [facility] [place]. Please call patient and advise.

## 2020-02-01 NOTE — Telephone Encounter (Signed)
Per Dr Everlene Farrier gave the OK to refill patient prescriptions due to patient having another flare up with Diverticulitis. Prescriptions were sent to local pharmacy and patient will be notify when ready for pick up.

## 2020-02-21 ENCOUNTER — Telehealth: Payer: Self-pay | Admitting: Surgery

## 2020-02-21 NOTE — Telephone Encounter (Signed)
I spoke with Dr.Pabon-ideally he would like to see the patient in office tomorrow- the patient's wife stated he could not come in-he is on the road- I offered to order CT scan and labs and it was stated that he could not do this due to being on the road- I suggested the patient be seen in emergency room somewhere and she stated he would not do this due to having to sit and wait long hours- I strongly encouraged the patient to go straight to an emergency room  if he has fever, chills, nausea and vomiting and increased pain.

## 2020-02-21 NOTE — Telephone Encounter (Signed)
Patient is calling, said they don't have an appt until the 17th, patient is needing an antibiotic called in for her husband amoxiclav 875, and metrozol 500  patient is using [facility] in [place]. Please call patient and advise.

## 2020-02-21 NOTE — Telephone Encounter (Signed)
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 Call wife at [phone]. Requesting a refill on Antibiotics-trying to get through till January so he can have the surgery that was discussed.

## 2020-02-27 ENCOUNTER — Encounter: Payer: Self-pay | Admitting: Surgery

## 2020-02-27 ENCOUNTER — Ambulatory Visit (INDEPENDENT_AMBULATORY_CARE_PROVIDER_SITE_OTHER): Payer: BC Managed Care – PPO | Admitting: Surgery

## 2020-02-27 ENCOUNTER — Other Ambulatory Visit
Admission: RE | Admit: 2020-02-27 | Discharge: 2020-02-27 | Disposition: A | Discharge status: Home / Self Care | Payer: BC Managed Care – PPO | Source: Ambulatory Visit | Attending: Surgery | Admitting: Surgery

## 2020-02-27 ENCOUNTER — Other Ambulatory Visit: Payer: Self-pay

## 2020-02-27 VITALS — BP 160/92 | HR 102 | Temp 98.0°F | Ht 68.0 in | Wt 323.8 lb

## 2020-02-27 DIAGNOSIS — K5732 Diverticulitis of large intestine without perforation or abscess without bleeding: Secondary | ICD-10-CM

## 2020-02-27 LAB — COMPREHENSIVE METABOLIC PANEL
ALT: 22 U/L (ref 0–44)
AST: 23 U/L (ref 15–41)
Albumin: 3.7 g/dL (ref 3.5–5.0)
Alkaline Phosphatase: 74 U/L (ref 38–126)
Anion gap: 11 (ref 5–15)
BUN: 13 mg/dL (ref 6–20)
CO2: 22 mmol/L (ref 22–32)
Calcium: 8.9 mg/dL (ref 8.9–10.3)
Chloride: 108 mmol/L (ref 98–111)
Creatinine, Ser: 0.47 mg/dL — ABNORMAL LOW (ref 0.61–1.24)
GFR, Estimated: >60 mL/min (ref >60)
Glucose, Bld: 202 mg/dL — ABNORMAL HIGH (ref 70–99)
Potassium: 3.7 mmol/L (ref 3.5–5.1)
Sodium: 141 mmol/L (ref 135–145)
Total Bilirubin: 0.5 mg/dL (ref 0.3–1.2)
Total Protein: 7.6 g/dL (ref 6.5–8.1)

## 2020-02-27 LAB — CBC WITH DIFFERENTIAL/PLATELET
Abs Immature Granulocytes: 0.05 10*3/uL (ref 0.00–0.07)
Basophils Absolute: 0.1 10*3/uL (ref 0.0–0.1)
Basophils Relative: 1 %
Eosinophils Absolute: 0.4 10*3/uL (ref 0.0–0.5)
Eosinophils Relative: 4 %
HCT: 40.9 % (ref 39.0–52.0)
Hemoglobin: 13.3 g/dL (ref 13.0–17.0)
Immature Granulocytes: 1 %
Lymphocytes Relative: 29 %
Lymphs Abs: 2.7 10*3/uL (ref 0.7–4.0)
MCH: 28.4 pg (ref 26.0–34.0)
MCHC: 32.5 g/dL (ref 30.0–36.0)
MCV: 87.2 fL (ref 80.0–100.0)
Monocytes Absolute: 1 10*3/uL (ref 0.1–1.0)
Monocytes Relative: 11 %
Neutro Abs: 5.1 10*3/uL (ref 1.7–7.7)
Neutrophils Relative %: 54 %
Platelets: 271 10*3/uL (ref 150–400)
RBC: 4.69 MIL/uL (ref 4.22–5.81)
RDW: 15.5 % (ref 11.5–15.5)
WBC: 9.3 10*3/uL (ref 4.0–10.5)
nRBC: 0 % (ref 0.0–0.2)

## 2020-02-27 MED ORDER — AMOXICILLIN-POT CLAVULANATE 875-125 MG PO TABS: 1.0000 | ORAL_TABLET | Freq: Two times a day (BID) | ORAL | 0 refills | Status: AC

## 2020-02-27 MED ORDER — METRONIDAZOLE 500 MG PO TABS: 500.0000 mg | ORAL_TABLET | Freq: Three times a day (TID) | ORAL | 0 refills | Status: AC

## 2020-02-27 NOTE — Progress Notes (Signed)
02/27/2020  HPI: Dustin Mcintyre is a 54 y.o. male with history of recurrent/persistent diverticulitis.  He had required multiple courses of antibiotics and he is planned for possible left colectomy in January with Dr. Everlene Farrier.  He called the office because of again flareup of pain that started three days ago.  He tried taking some residual antibiotic from prior treatment and noticed an improvement.  His pain is in the left lower quadrant mostly, with reports of burning/pressure sensation.  He had some fevers/chills initially and that has resolved.  Denies any chest pain, shortness of breath, nausea, or vomiting.  He is asking for another course of antibiotics.  Vital signs: BP (!) 160/92   Pulse (!) 102   Temp 98 F (36.7 C) (Oral)   Ht 5\' 8"  (1.727 m)   Wt (!) 323 lb 12.8 oz (146.9 kg)   SpO2 96%   BMI 49.23 kg/m    Physical Exam: Constitutional:  No acute distress Respiratory:  Normal respiratory effort without use of accessory muscles. Cardiac:  Regular rhythm and rate Abdomen:  Soft, obese, non-distended, with some tenderness to palpation in the left abdomen.  No peritonitis, non-toxic.  Assessment/Plan: This is a 54 y.o. male with recurrent diverticulitis.  --At this point the patient is not peritonitic or toxic.  Discussed with him that he most likely has a flareup of his diverticulitis, but I do not think he needs to be hospitalized.  Will prescribe prior regimen of Augmentin and Flagyl that has worked for him.  Will also order new set of labs as a precaution.  If his WBC is markedly elevated, may need CT scan to evaluate for any complicating factors.  Otherwise, follow up with Dr. 53 in two weeks.   Everlene Farrier, MD [facility]

## 2020-02-27 NOTE — Patient Instructions (Addendum)
Please have your labs drawn at [facility] lab. Continue taking antibiotics. Please pick up at your pharmacy. You have been excused from work until 02/29/2020. If you have any concerns or questions, please feel free to call our office. Please see your follow up appointment with Dr. Everlene Farrier below.

## 2020-03-12 ENCOUNTER — Telehealth: Payer: Self-pay

## 2020-03-12 ENCOUNTER — Telehealth: Payer: Self-pay | Admitting: Nurse Practitioner

## 2020-03-12 ENCOUNTER — Other Ambulatory Visit: Payer: BC Managed Care – PPO

## 2020-03-12 DIAGNOSIS — E119 Type 2 diabetes mellitus without complications: Secondary | ICD-10-CM

## 2020-03-12 MED ORDER — SITAGLIPTIN PHOSPHATE 100 MG PO TABS: ORAL_TABLET | ORAL | 1 refills | Status: AC

## 2020-03-12 NOTE — Telephone Encounter (Signed)
Pt's wife Porfirio Mylar called and states that pt will need a refill on sitaGLIPtin (JANUVIA) 100 MG tablet sent to [facility] in [place] while they find another PCP for pt. She states that they will not be going to be going to [facility] because they do not like that location. She states that the pt has one week left of medication.

## 2020-03-12 NOTE — Telephone Encounter (Signed)
Pt wife called to cancel pt lab appt  Told pt that [facility] was no longer here and that she would needed to call [facility] she stated that she will not go there because that is where they went previously.  She stated that she is no longer our pt because of the way we code and bill.

## 2020-03-13 ENCOUNTER — Telehealth: Payer: Self-pay | Admitting: *Deleted

## 2020-03-13 ENCOUNTER — Other Ambulatory Visit: Payer: Self-pay

## 2020-03-13 DIAGNOSIS — J019 Acute sinusitis, unspecified: Secondary | ICD-10-CM

## 2020-03-13 MED ORDER — METRONIDAZOLE 500 MG PO TABS: 500.0000 mg | ORAL_TABLET | Freq: Four times a day (QID) | ORAL | 0 refills | Status: AC

## 2020-03-13 MED ORDER — AMOXICILLIN-POT CLAVULANATE 875-125 MG PO TABS: 1.0000 | ORAL_TABLET | Freq: Two times a day (BID) | ORAL | 0 refills | Status: DC

## 2020-03-13 NOTE — Telephone Encounter (Signed)
[name] wife is calling and said her husband has had another flare up and is needing these medications refilled patient is needing amoxiclav 875 metrozol 500 and [facility] [place]. Please call patient and advise. Patient has an appointment to discuss surgery on Monday.

## 2020-03-13 NOTE — Telephone Encounter (Signed)
LVM for pt about refills and upcoming appt. Augmentin and Flagyl sent to [facility].

## 2020-03-13 NOTE — Telephone Encounter (Signed)
We can call in augmentin 875 bid and flagyl 500mg  tid for another 10 days but he needs to come and see me as well in 1-2 weeks

## 2020-03-15 ENCOUNTER — Other Ambulatory Visit: Payer: Self-pay

## 2020-03-15 ENCOUNTER — Other Ambulatory Visit: Payer: BC Managed Care – PPO

## 2020-03-15 DIAGNOSIS — Z20822 Contact with and (suspected) exposure to covid-19: Secondary | ICD-10-CM

## 2020-03-17 LAB — NOVEL CORONAVIRUS, NAA: SARS-CoV-2, NAA: NOT DETECTED

## 2020-03-17 LAB — SARS-COV-2, NAA 2 DAY TAT

## 2020-03-19 ENCOUNTER — Ambulatory Visit: Payer: BC Managed Care – PPO | Admitting: Surgery

## 2020-03-26 ENCOUNTER — Other Ambulatory Visit: Payer: Self-pay

## 2020-03-26 ENCOUNTER — Telehealth: Payer: Self-pay

## 2020-03-26 ENCOUNTER — Telehealth: Payer: BC Managed Care – PPO | Admitting: Family Medicine

## 2020-03-26 ENCOUNTER — Telehealth: Payer: Self-pay | Admitting: Surgery

## 2020-03-26 ENCOUNTER — Encounter: Payer: Self-pay | Admitting: Family Medicine

## 2020-03-26 DIAGNOSIS — E1165 Type 2 diabetes mellitus with hyperglycemia: Secondary | ICD-10-CM | POA: Diagnosis not present

## 2020-03-26 DIAGNOSIS — U071 COVID-19: Secondary | ICD-10-CM

## 2020-03-26 MED ORDER — PREDNISONE 10 MG (21) PO TBPK: ORAL_TABLET | ORAL | 0 refills | Status: DC

## 2020-03-26 MED ORDER — BENZONATATE 100 MG PO CAPS: 100.0000 mg | ORAL_CAPSULE | Freq: Three times a day (TID) | ORAL | 0 refills | Status: DC | PRN

## 2020-03-26 NOTE — Progress Notes (Signed)
Dustin Mcintyre, Dustin Mcintyre are scheduled for a virtual visit with your provider today.    Just as we do with appointments in the office, we must obtain your consent to participate.  Your consent will be active for this visit and any virtual visit you may have with one of our providers in the next 365 days.    If you have a MyChart account, I can also send a copy of this consent to you electronically.  All virtual visits are billed to your insurance company just like a traditional visit in the office.  As this is a virtual visit, video technology does not allow for your provider to perform a traditional examination.  This may limit your provider's ability to fully assess your condition.  If your provider identifies any concerns that need to be evaluated in person or the need to arrange testing such as labs, EKG, etc, we will make arrangements to do so.    Although advances in technology are sophisticated, we cannot ensure that it will always work on either your end or our end.  If the connection with a video visit is poor, we may have to switch to a telephone visit.  With either a video or telephone visit, we are not always able to ensure that we have a secure connection.   I need to obtain your verbal consent now.   Are you willing to proceed with your visit today?   Dustin Mcintyre has provided verbal consent on 03/26/2020 for a virtual visit (video or telephone).   Dustin Finner, NP 03/26/2020  9:17 AM   Date:  03/26/2020   ID:  Dustin Mcintyre, DOB 1966/02/19, MRN [id number]  Patient Location: Home Provider Location: Office/Clinic   Participants: Patient and Provider for Visit and Wrap up  Method of visit: Telephone  Location of Patient: Home Location of Provider: Home Office Consent was obtain for visit over the telephone Services rendered by provider: Visit was performed via telephone/audio.  Patient was unable to connect via video with audio, so appt was switched to telephone at the start.  I  verified that I am speaking with the correct person using two identifiers.  PCP:  Theadore Nan, NP   Chief Complaint:  covid +   History of Present Illness:    Dustin Mcintyre is a 55 y.o. male with history as stated below. Presents video telehealth for an acute care visit elevated at home Covid test.  Date of onset of symptoms was Sunday.  Reports that his coworker had tested positive and he was allowed to work in soon after he had symptoms started.  His symptoms include and have been persistent with congestion, pressure in the head, body aches, headaches, hot and cold sweats does not have a thermometer to test for fever however does feel like he probably has one, ear pain especially from drainage.  Is coughing up mucus-does not feel short of breath or have chest pain at this time.  Additionally he did have a first Covid 19 and 2020 and never received vaccines.  Of note currently is on Augmentin and Flagyl secondary to having diverticulitis flareup.  Denies having shortness of breath, cough, chest pain.  Needs a work note.  The patient does have symptoms concerning for COVID-19 infection (fever, chills, cough, or new shortness of breath).   Past Medical, Surgical, Social History, Allergies, and Medications have been Reviewed.  Past Medical History:  Diagnosis Date  . Diverticulitis   . Hay fever  Current Meds  Medication Sig  . benzonatate (TESSALON) 100 MG capsule Take 1 capsule (100 mg total) by mouth 3 (three) times daily as needed for cough.  . predniSONE (STERAPRED UNI-PAK 21 TAB) 10 MG (21) TBPK tablet Take as directed     Allergies:   Patient has no known allergies.   ROS See HPI for history of present illness.  Physical Exam Alert and oriented x4, no shortness of breath noted in conversation, judgment and mood normal.  There are no diagnoses linked to this encounter.  1. COVID-19 virus detected -Encouraged to isolate at home for the next 4 days, can  return to work if symptoms and fever have abated.  Otherwise stay out for 10 days. -No antibiotics needed at this time.  Is currently on Augmentin and Flagyl for diverticulitis -Vitamin C, vitamin D, zinc, B complex encouraged -Hydration is encouraged -Prednisone pack, Mucinex, Tessalon Perles provided -Work note provided -Vaccines post 90 days encouraged -Diabetic sick day measures were reviewed  - predniSONE (STERAPRED UNI-PAK 21 TAB) 10 MG (21) TBPK tablet; Take as directed  Dispense: 21 tablet; Refill: 0 - benzonatate (TESSALON) 100 MG capsule; Take 1 capsule (100 mg total) by mouth 3 (three) times daily as needed for cough.  Dispense: 30 capsule; Refill: 0  2. Type 2 diabetes mellitus with hyperglycemia, without long-term current use of insulin (HCC) -Safety measures were reviewed secondary to having type 2 diabetes.  Time:   Today, I have spent 21 minutes non face to face time with the patient with telehealth technology discussing the above problems, reviewing the chart, previous notes, medications and orders.    Tests Ordered: No orders of the defined types were placed in this encounter.   Medication Changes: Meds ordered this encounter  Medications  . predniSONE (STERAPRED UNI-PAK 21 TAB) 10 MG (21) TBPK tablet    Sig: Take as directed    Dispense:  21 tablet    Refill:  0    Order Specific Question:   Supervising Provider    Answer:   SIMPSON, MARGARET E [[id number]]  . benzonatate (TESSALON) 100 MG capsule    Sig: Take 1 capsule (100 mg total) by mouth 3 (three) times daily as needed for cough.    Dispense:  30 capsule    Refill:  0    Order Specific Question:   Supervising Provider    Answer:   Kerri Perches [[id number]]     Disposition:  Follow up with PCP as needed Signed, Dustin Finner, NP  03/26/2020 9:17 AM

## 2020-03-26 NOTE — Assessment & Plan Note (Addendum)
-  Encouraged to isolate at home for the next 4 days, can return to work if symptoms and fever have abated.  Otherwise stay out for 10 days. -No antibiotics needed at this time.  Is currently on Augmentin and Flagyl for diverticulitis -Vitamin C, vitamin D, zinc, B complex encouraged -Hydration is encouraged -Prednisone pack, Mucinex, Tessalon Perles provided -Work note provided -Vaccines post 90 days encouraged -Diabetic sick day measures were reviewed -Patient and wife verbalized understanding

## 2020-03-26 NOTE — Patient Instructions (Signed)
I appreciate the opportunity to provide you with care for your health and wellness.  Follow up: As needed  You are encouraged to isolate at home for the next 4 days when she reaches her 5-day mark if your fevers have gone away and your symptoms have improved outside of your cough which can linger for several weeks you can return to work.  Your work note does say if you still have symptoms and you have a fever you are to stay out for 10 days.  No antibiotics are needed at this time secondary to Covid being a virus.    It is encouraged that she get vitamin C, vitamin D, zinc and B complex to take over-the-counter for the next 2 weeks.  If you are taking a multivitamin you can just increase your vitamin C over the next several weeks to help with your immune system.  Hydration is encouraged.  Prednisone pack, Tessalon Perles are provided for your coughing  Please pick up Mucinex if you feel that you are producing a lot of mucus from your cough  Please consider getting vaccines 90 days from now so that you can be protected as you never know how your body will respond to Covid.  Please make sure that you are keeping an eye on your blood sugars for safety measures as needed.  Please continue to practice social distancing to keep you, your family, and our community safe.  If you must go out, please wear a mask and practice good handwashing.  Have a wonderful day. With Gratitude, Tereasa Coop, DNP, AGNP-BC

## 2020-03-26 NOTE — Telephone Encounter (Signed)
Patient Is calling and is needing refills on his antibiotics was not sure the name of it but there was two of them, patient has also tested positive yesterday. Please call patient and advise.patinet is also due to come in on Monday the 31st.

## 2020-03-26 NOTE — Telephone Encounter (Signed)
Pt's wife called and said that pt has covid and wanted a visit today regarding his symptoms and a work note to go back to work when he is over covid. She said she couldn't get into his new doctor's office until February.  I let her know that we didn't have any appointments today and that they could do a myChart virtual visit with a provider within [facility]. Pt's wife said she felt like since she was told her husband's dr left we just dropped them as a patient. I let her know that wasn't the case and that we would see him virtually if we had an appointment but we don't. She told me she wasn't tech savvy to be doing all of that. I let her know that we are not seeing covid patients in the office so this would be the only way the pt would be seen unless they went to an urgent care. She then says that [place] has fell downhill and that she would be putting this on social media and any survey that she can. Then she hung up.

## 2020-03-26 NOTE — Assessment & Plan Note (Signed)
Safety measures were reviewed secondary to him having type 2 diabetes.  Verbalized understanding

## 2020-03-27 ENCOUNTER — Telehealth: Payer: Self-pay | Admitting: Surgery

## 2020-03-27 ENCOUNTER — Telehealth: Payer: Self-pay | Admitting: Gastroenterology

## 2020-03-27 NOTE — Telephone Encounter (Signed)
This Is a 55 year old patient that I saw him once 3 months ago for recurrent diverticulitis.  He keeps calling every few weeks for refill on antibiotics.  I did see my CMA's note.  We cannot give indefinitely treatment for antibiotics.  He understand that there is a risk for C. difficile.  More importantly he is Covid positive and we oriented him to go to his PCP and seek appropriate treatment.  I do not think that is appropriate for [place] to continue to refill antibiotic therapy and do treatments over the phone without having a complete evaluation.  We did offer him a complete evaluation and he keeps refusing them. The has significant comorbidities and has super morbid obesity with a BMI close to 50. To think that at some point time he will require sigmoid colectomy but again this is very morbid procedure on a patient with significant risk factors. We will be happy to see him in person next week but for current covid infection he needs to fins appropiate provider.

## 2020-03-27 NOTE — Telephone Encounter (Signed)
See note

## 2020-03-27 NOTE — Telephone Encounter (Signed)
Patient having a diverticulitis flare and needs  amoxicillin-clavulanate (AUGMENTIN) 875-125 MG tablet and Metronidazole 500 mg x3 a day  [facility] in [place]  Please call Porfirio Mylar # [phone] when sent to pharmacy.

## 2020-03-27 NOTE — Telephone Encounter (Signed)
Please refill this for the patient but that him know that a definitive decision needs to be made on moving forward with this recurrent diverticulitis.

## 2020-03-27 NOTE — Telephone Encounter (Signed)
Patients wife is calling again said she never heard from anybody, please call patient and advise.

## 2020-03-27 NOTE — Telephone Encounter (Signed)
Wife calls again, she said this is the third time she has called.  Her husband has an appointment with Dr. Everlene Farrier on 1/31.  He needs refills on Augmentin and Matranidazole, he took last dose this morning. She states that he needs these meds refills asap please.  She also states that her husband has just tested positive for Covid, so not quite sure just yet about his appointment for 04/02/20.   But she states he needs to have these meds refilled today. Please call them.  Numbers in chart has been confirmed and correct.  Thank you.

## 2020-03-27 NOTE — Telephone Encounter (Signed)
Spoke with patient's wife Dustin Mcintyre regarding requesting refills on Augmentin and Flagyl. Patient was last prescribed refills on both of the prescriptions on 03/13/20 by Ailene Ards, CMA. Dustin Mcintyre was advised to contact patient's primary care provider's office to inquire about refills for patient. Dustin Mcintyre stated that the patient's primary care provider has since left the practice and they were informed that they would be hiring a new PCP in place of the former PCP and have yet to do so, so patients were being sent out to new doctors. Patient has an appointment scheduled for February and recently did a virtual visit, but was told that they could not refill medications due to him not being an established patient.Dr.Pabon stated patient would need a PCP as he could not prescribe the patient refills indefinitely and he recommends patient to be evaluated for COVID and did not want to risk potential exposure. I suggested patient try and contact patient's gastroenterologist office to see if they could possibly refill patient's prescriptions. Patient also recently tested positive for COVID and was advised to reschedule upcoming appointment to later on into the next week and patient states he will be finding another doctor's office due to him being unhappy with our practice. I apologized to patient and advise Dustin Mcintyre to give our office a call back if there was anything we could do.

## 2020-03-28 ENCOUNTER — Other Ambulatory Visit: Payer: Self-pay

## 2020-03-28 DIAGNOSIS — J019 Acute sinusitis, unspecified: Secondary | ICD-10-CM

## 2020-03-28 MED ORDER — AMOXICILLIN-POT CLAVULANATE 875-125 MG PO TABS: 1.0000 | ORAL_TABLET | Freq: Two times a day (BID) | ORAL | 0 refills | Status: DC

## 2020-03-28 MED ORDER — METRONIDAZOLE 500 MG PO TABS: 500.0000 mg | ORAL_TABLET | Freq: Three times a day (TID) | ORAL | 0 refills | Status: DC

## 2020-03-28 NOTE — Telephone Encounter (Signed)
Spoke with pt's wife, Dustin Mcintyre and advised her, per Dr. Servando Snare this will be the last round of antibiotics he will prescribe as he is at risk of developing c-diff as well as he has to make a decision on moving forward with possible surgery. Pt's wife stated he has an appt with a new PCP, Dr. Clydie Braun with [facility] on 04/09/20 to establish care.They will also discuss a new referral to another surgeon. Pt's wife verbalized understanding.

## 2020-04-02 ENCOUNTER — Encounter: Payer: Self-pay | Admitting: Family Medicine

## 2020-04-02 ENCOUNTER — Ambulatory Visit: Payer: Self-pay | Admitting: Surgery

## 2020-04-02 ENCOUNTER — Telehealth: Payer: BC Managed Care – PPO | Admitting: Family Medicine

## 2020-04-02 DIAGNOSIS — R0602 Shortness of breath: Secondary | ICD-10-CM | POA: Diagnosis not present

## 2020-04-02 DIAGNOSIS — R053 Chronic cough: Secondary | ICD-10-CM | POA: Diagnosis not present

## 2020-04-02 DIAGNOSIS — J329 Chronic sinusitis, unspecified: Secondary | ICD-10-CM | POA: Diagnosis not present

## 2020-04-02 DIAGNOSIS — R0989 Other specified symptoms and signs involving the circulatory and respiratory systems: Secondary | ICD-10-CM

## 2020-04-02 DIAGNOSIS — U071 COVID-19: Secondary | ICD-10-CM

## 2020-04-02 DIAGNOSIS — R062 Wheezing: Secondary | ICD-10-CM

## 2020-04-02 MED ORDER — PREDNISONE 20 MG PO TABS: 20.0000 mg | ORAL_TABLET | Freq: Every day | ORAL | 0 refills | Status: DC

## 2020-04-02 MED ORDER — ALBUTEROL SULFATE HFA 108 (90 BASE) MCG/ACT IN AERS: 2.0000 | INHALATION_SPRAY | RESPIRATORY_TRACT | 0 refills | Status: AC | PRN

## 2020-04-02 MED ORDER — DEXTROMETHORPHAN HBR 15 MG/5ML PO SYRP: 10.0000 mL | ORAL_SOLUTION | Freq: Four times a day (QID) | ORAL | 0 refills | Status: DC | PRN

## 2020-04-02 NOTE — Patient Instructions (Signed)
You were diagnosed with COVID-19 on 03/26/2020, you are outside of window of infectivity.  However you continue to have symptoms, you want supportive care.  Will do an additional 3 days of prednisone 20 mg with breakfast.  No further antibiotics warranted at this time.  Continue your chronic antibiotics that were prescribed for diverticulitis.  For cough, dextromethorphan every 6 hours as needed. Albuterol inhaler 2 puffs every 4 hours as needed for wheezing, shortness of breath, and/or persistent coughing.  If symptoms worsen, report to closest emergency department for further management of symptoms.  Thank you for allowing [place] to be a part of your care  10 Things You Can Do to Manage Your COVID-19 Symptoms at Home If you have possible or confirmed COVID-19: 1. Stay home except to get medical care. 2. Monitor your symptoms carefully. If your symptoms get worse, call your healthcare provider immediately. 3. Get rest and stay hydrated. 4. If you have a medical appointment, call the healthcare provider ahead of time and tell them that you have or may have COVID-19. 5. For medical emergencies, call 911 and notify the dispatch personnel that you have or may have COVID-19. 6. Cover your cough and sneezes with a tissue or use the inside of your elbow. 7. Wash your hands often with soap and water for at least 20 seconds or clean your hands with an alcohol-based hand sanitizer that contains at least 60% alcohol. 8. As much as possible, stay in a specific room and away from other people in your home. Also, you should use a separate bathroom, if available. If you need to be around other people in or outside of the home, wear a mask. 9. Avoid sharing personal items with other people in your household, like dishes, towels, and bedding. 10. Clean all surfaces that are touched often, like counters, tabletops, and doorknobs. Use household cleaning sprays or wipes according to the label  instructions. [email or web address] 09/16/2019 This information is not intended to replace advice given to you by your health care provider. Make sure you discuss any questions you have with your health care provider. Document Revised: 01/02/2020 Document Reviewed: 01/02/2020 Elsevier Patient Education  2021 Elsevier Inc. Dextromethorphan oral suspension What is this medicine? DEXTROMETHORPHAN (dex troe meth OR fan) is used to help relieve cough. This medicine may be used for other purposes; ask your health care provider or pharmacist if you have questions. COMMON BRAND NAME(S): AeroTuss, Buckley's Cough Suppressant, Buckley's DM, Buckley's Mixture, Cough DM, Delsym, Delsym Children's, Delsym Children's Cough Relief, [employer] Cough DM, Robitussin Children's Cough, Robitussin Cough What should I tell my health care provider before I take this medicine? They need to know if you have any of these conditions:  asthma  emphysema  large amount of mucus  liver disease  smoker  an unusual or allergic reaction to dextromethorphan, other medicines, bromides, foods, dyes, or preservatives  pregnant or trying to get pregnant  breast-feeding How should I use this medicine? Take this medicine by mouth. Follow the directions on the prescription label. Shake well before using. Use a specially marked spoon or dropper to measure each dose. Ask your pharmacist if you do not have one. Household spoons are not accurate. Take your medicine at regular intervals. Do not take it more often than directed. Talk to your pediatrician regarding the use of this medicine in children. While this drug may be prescribed for children as young as 33 years old for selected conditions, precautions do apply. Overdosage: If you think  you have taken too much of this medicine contact a poison control center or emergency room at once. NOTE: This medicine is only for you. Do not share this medicine with others. What if I miss  a dose? If you miss a dose, take it as soon as you can. If it is almost time for your next dose, take only that dose. Do not take double or extra doses. What may interact with this medicine? Do not take this medicine with any of the following medications:  MAOIs like Carbex, Eldepryl, Marplan, Nardil, and Parnate This medicine may also interact with the following medications:  medicines for depression, anxiety, or psychotic disturbances  other medicines for allergies or cold  procarbazine This list may not describe all possible interactions. Give your health care provider a list of all the medicines, herbs, non-prescription drugs, or dietary supplements you use. Also tell them if you smoke, drink alcohol, or use illegal drugs. Some items may interact with your medicine. What should I watch for while using this medicine? Do not treat yourself for a cough for more than 1 week without consulting your doctor or health care professional. If you have a high fever, skin rash, lasting headache, or sore throat, see your doctor. You may get drowsy or dizzy. Do not drive, use machinery, or do anything that needs mental alertness until you know how this medicine affects you. Do not stand or sit up quickly, especially if you are an older patient. This reduces the risk of dizzy or fainting spells. Alcohol may interfere with the effect of this medicine. Avoid alcoholic drinks. What side effects may I notice from receiving this medicine? Side effects that you should report to your doctor or health care professional as soon as possible:  allergic reactions like skin rash, itching or hives, swelling of the face, lips, or tongue  breathing problems  confusion  excitement, nervousness, restlessness, or irritability  seizure  slurred speech Side effects that usually do not require medical attention (report to your doctor or health care professional if they continue or are  bothersome):  headache  stomach upset  tiredness This list may not describe all possible side effects. Call your doctor for medical advice about side effects. You may report side effects to FDA at 1-800-FDA-1088. Where should I keep my medicine? Keep out of the reach of children. Store at room temperature between 20 and 25 degrees C (68 and 77 degrees F) unless otherwise directed. Protect from light. Do not freeze. Throw away any unused medicine after the expiration date. NOTE: This sheet is a summary. It may not cover all possible information. If you have questions about this medicine, talk to your doctor, pharmacist, or health care provider.  2021 Elsevier/Gold Standard (2007-06-10 16:57:31)

## 2020-04-02 NOTE — Progress Notes (Signed)
[name], Dustin Mcintyre are scheduled for a virtual visit with your provider today.    Just as we do with appointments in the office, we must obtain your consent to participate.  Your consent will be active for this visit and any virtual visit you may have with one of our providers in the next 365 days.    If you have a MyChart account, I can also send a copy of this consent to you electronically.  All virtual visits are billed to your insurance company just like a traditional visit in the office.  As this is a virtual visit, video technology does not allow for your provider to perform a traditional examination.  This may limit your provider's ability to fully assess your condition.  If your provider identifies any concerns that need to be evaluated in person or the need to arrange testing such as labs, EKG, etc, we will make arrangements to do so.    Although advances in technology are sophisticated, we cannot ensure that it will always work on either your end or our end.  If the connection with a video visit is poor, we may have to switch to a telephone visit.  With either a video or telephone visit, we are not always able to ensure that we have a secure connection.   I need to obtain your verbal consent now.   Are you willing to proceed with your visit today?   Dustin Mcintyre has provided verbal consent on 04/02/2020 for a virtual visit (video or telephone).  Virtual Visit via Video Note  I connected with Dustin Mcintyre on 04/02/20 at  3:45 PM EST by a video enabled telemedicine application and verified that I am speaking with the correct person using two identifiers.  Location: Patient: Home Provider: Clinic   I discussed the limitations of evaluation and management by telemedicine and the availability of in person appointments. The patient expressed understanding and agreed to proceed.  History of Present Illness:  Dustin Mcintyre is a 55 year old male with a medical history significant for type 2  diabetes mellitus, hyperlipidemia, morbid obesity, and diverticulitis presents initially via video and transition to telephone with complaints of sinus pressure, sinus pain, sinus headache, persistent cough, wheezing, shortness of breath, and fatigue over the last week.  On 03/26/2020 patient was diagnosed with COVID-19 infection.  He states that he continues to have worsening shortness of breath, wheezing, and chills.  Patient has taken over-the-counter medications and is on chronic antibiotics for diverticulitis including Augmentin and metronidazole.  Patient was also prescribed some prednisone several days ago and states that symptoms returned after completing prednisone.  He endorses persistent coughing which is characterized as productive.  He currently denies fever, chest pain, nausea, vomiting, or diarrhea.  He also denies any urinary symptoms.Patient has not been vaccinated against COVID 19.  Patient is also requesting a return to work note.   Past Medical History:  Diagnosis Date  . Diverticulitis   . Hay fever    Social History   Socioeconomic History  . Marital status: Married    Spouse name: Not on file  . Number of children: Not on file  . Years of education: Not on file  . Highest education level: Not on file  Occupational History  . Not on file  Tobacco Use  . Smoking status: Never Smoker  . Smokeless tobacco: Current User    Types: Chew  Vaping Use  . Vaping Use: Never used  Substance and Sexual Activity  .  Alcohol use: Yes  . Drug use: Never  . Sexual activity: Yes  Other Topics Concern  . Not on file  Social History Narrative  . Not on file   Social Determinants of Health   Financial Resource Strain: Not on file  Food Insecurity: Not on file  Transportation Needs: Not on file  Physical Activity: Not on file  Stress: Not on file  Social Connections: Not on file  Intimate Partner Violence: Not on file   Immunization History  Administered Date(s) Administered   . Tdap 04/19/2018    No Known Allergies Review of Systems  Constitutional: Positive for weight loss.  HENT: Positive for congestion and sinus pain.   Eyes: Negative.   Respiratory: Positive for cough, sputum production, shortness of breath and wheezing.   Cardiovascular: Negative.  Negative for chest pain.  Gastrointestinal: Negative.   Genitourinary: Negative.   Musculoskeletal: Positive for back pain and joint pain.  Skin: Negative.   Neurological: Negative.   Psychiatric/Behavioral: Negative.      Assessment and Plan: 1. Symptoms of upper respiratory infection (URI) Patient is on chronic antibiotics due to diverticulitis.  Advised to continue.  No additional antibiotics warranted at this time.  Also, discussed the fact that COVID-19 is a virus. - dextromethorphan 15 MG/5ML syrup; Take 10 mLs (30 mg total) by mouth 4 (four) times daily as needed for cough.  Dispense: 120 mL; Refill: 0 - predniSONE (DELTASONE) 20 MG tablet; Take 1 tablet (20 mg total) by mouth daily with breakfast.  Dispense: 3 tablet; Refill: 0 - albuterol (VENTOLIN HFA) 108 (90 Base) MCG/ACT inhaler; Inhale 2 puffs into the lungs every 4 (four) hours as needed for wheezing or shortness of breath.  Dispense: 8 g; Refill: 0  2. Persistent cough Increase fluid intake to about 32 ounces per day - dextromethorphan 15 MG/5ML syrup; Take 10 mLs (30 mg total) by mouth 4 (four) times daily as needed for cough.  Dispense: 120 mL; Refill: 0  3. Chest congestion - dextromethorphan 15 MG/5ML syrup; Take 10 mLs (30 mg total) by mouth 4 (four) times daily as needed for cough.  Dispense: 120 mL; Refill: 0  4. SOB (shortness of breath) - albuterol (VENTOLIN HFA) 108 (90 Base) MCG/ACT inhaler; Inhale 2 puffs into the lungs every 4 (four) hours as needed for wheezing or shortness of breath.  Dispense: 8 g; Refill: 0  5. Chronic sinusitis, unspecified location Continue with current antibiotic therapy, no new antibiotics  warranted on today.  6. Wheezing - albuterol (VENTOLIN HFA) 108 (90 Base) MCG/ACT inhaler; Inhale 2 puffs into the lungs every 4 (four) hours as needed for wheezing or shortness of breath.  Dispense: 8 g; Refill: 0  Follow Up Instructions:   Patient is scheduled to establish care with PCP on 04/09/2020, encouraged to go to appointment.  I discussed the assessment and treatment plan with the patient. The patient was provided an opportunity to ask questions and all were answered. The patient agreed with the plan and demonstrated an understanding of the instructions.   The patient was advised to call back or seek an in-person evaluation if the symptoms worsen or if the condition fails to improve as anticipated.  I provided 10 minutes of non-face-to-face time during this encounter.  Nolon Nations  APRN, MSN, FNP-C [facility] [address] [place], [place] [address] [phone]  04/02/2020  3:57 PM

## 2020-04-15 ENCOUNTER — Other Ambulatory Visit: Payer: Self-pay | Admitting: Family Medicine

## 2020-04-15 DIAGNOSIS — R062 Wheezing: Secondary | ICD-10-CM

## 2020-04-15 DIAGNOSIS — R0989 Other specified symptoms and signs involving the circulatory and respiratory systems: Secondary | ICD-10-CM

## 2020-04-15 DIAGNOSIS — R0602 Shortness of breath: Secondary | ICD-10-CM

## 2020-04-25 ENCOUNTER — Telehealth: Payer: Self-pay | Admitting: Gastroenterology

## 2020-04-25 NOTE — Telephone Encounter (Signed)
That the patient know that your correct ginger, we do not prescribe IV antibiotics.  People cannot give themselves antibiotics without nursing help or being in the hospital.  If he is having 10 out of 10 pain and that sick then he should go to the nearest emergency room.

## 2020-04-25 NOTE — Telephone Encounter (Signed)
[name] wife called and husband is a Naval architect and on the road. Having diverticulitis flare (0-10 at a 10) Would like a IV liquid antibiotic called in ASAP.  Please call Porfirio Mylar @ [phone].

## 2020-04-25 NOTE — Telephone Encounter (Signed)
I have spoken with the patients wife regarding this most recent flare up of diverticulitis. I advised her he is at an extreme risk of either a perforation or c -diff. I advised her that the last prescription that was given by you was the last one. Pt has a new PCP appt on Monday with Dr. Sampson Goon and will request an urgent referral to surgery. Pt was not happy with Dr. Everlene Farrier and is wishing to switch surgeons. She said pt is in pain and is requesting one more refill. I advised her this would need to come from you. Advised her we don't order IV antibiotics from this office.

## 2020-04-27 NOTE — Telephone Encounter (Signed)
Pt's wife advised.

## 2020-04-30 ENCOUNTER — Ambulatory Visit: Payer: Self-pay | Admitting: Surgery

## 2020-04-30 DIAGNOSIS — E1169 Type 2 diabetes mellitus with other specified complication: Secondary | ICD-10-CM | POA: Diagnosis not present

## 2020-04-30 DIAGNOSIS — K760 Fatty (change of) liver, not elsewhere classified: Secondary | ICD-10-CM | POA: Diagnosis not present

## 2020-04-30 DIAGNOSIS — E785 Hyperlipidemia, unspecified: Secondary | ICD-10-CM | POA: Diagnosis not present

## 2020-04-30 DIAGNOSIS — K5792 Diverticulitis of intestine, part unspecified, without perforation or abscess without bleeding: Secondary | ICD-10-CM | POA: Diagnosis not present

## 2020-04-30 DIAGNOSIS — E119 Type 2 diabetes mellitus without complications: Secondary | ICD-10-CM | POA: Diagnosis not present

## 2020-04-30 DIAGNOSIS — K5732 Diverticulitis of large intestine without perforation or abscess without bleeding: Secondary | ICD-10-CM | POA: Diagnosis not present

## 2020-05-28 ENCOUNTER — Ambulatory Visit: Payer: Self-pay | Admitting: Surgery

## 2020-05-28 DIAGNOSIS — K5732 Diverticulitis of large intestine without perforation or abscess without bleeding: Secondary | ICD-10-CM | POA: Diagnosis not present

## 2020-05-28 DIAGNOSIS — Z8616 Personal history of COVID-19: Secondary | ICD-10-CM | POA: Diagnosis not present

## 2020-05-28 DIAGNOSIS — I313 Pericardial effusion (noninflammatory): Secondary | ICD-10-CM | POA: Diagnosis not present

## 2020-05-28 NOTE — H&P (Signed)
Dustin Mcintyre Appointment: 05/28/2020 3:15 PM Location: [facility] Patient #: [id number] DOB: 09/08/1965 Married / Language: [place] / Race: White Male  History of Present Illness Dustin Sportsman MD; 05/28/2020 5:46 PM) The patient is a 55 year old male who presents with diverticulitis. Note for "Diverticulitis": ` ` ` Patient sent for surgical consultation at the request of Dr Tonna Boehringer  Chief Complaint: Chronic diverticulitis. Second opinion ` ` The patient is a morbidly obese male who's had self-professed episodes of diverticulitis for the past few decades. Comes today with his wife. They've been to a lot of ERs and hospitals and doctors. Recall of their names is fair. Had documentation of diverticulitis on the CT scan fall 2021. Inflammation of sigmoid colon and also splenic flexure. He had symptoms on both sides. He's had some episodes of bloating and constipation with this. Felt some shortness of breath. Saw our cardiology and found to have a pericardial effusion but no myocardial infarction. Echocardiogram with decent function. Stabilized and improved. Has seen many different surgeons in [place]. Second opinion. Felt to be a higher risk given his morbid obesity. Seen gastroenterology. Patient has somehow been able to stay on antibiotics for the past 6 months, getting prescriptions from different doctors. He notes if he stays in the antibiotics his pain and symptoms are under control and he can work. If he comes off and he starts having recurrent pain and swelling. Very minimal in November which did not show any obvious masses or tumors. Had episode of emesis in the past. Wondered if he had gastritis but I think he's had an EGD.  Patient claims he can walk a half hour without difficulty. He does have diabetes. His A1c was over 10 many years ago but more recent one was around the sevens. At [employer]. No insulin. Denies any sleep apnea. He is  not smoke tobacco but he does dip. He's got down to a tin of dip a day. He still has some pain on his left lower and left upper side especially if he is off antibiotics. He feels like he's got stronger. He's had Covid at least twice. Last episode January. He refuses vaccinations. He's never had colonoscopy.  No personal nor family history of GI/colon cancer, inflammatory bowel disease, irritable bowel syndrome, allergy such as Celiac Sprue, dietary/dairy problems, colitis, ulcers nor gastritis. No recent sick contacts/gastroenteritis. No travel outside the country. No changes in diet. No dysphagia to solids or liquids. No significant heartburn or reflux. No melena, hematemesis, coffee ground emesis. No evidence of prior gastric/peptic ulceration.  (Review of systems as stated in this history (HPI) or in the review of systems. Otherwise all other 12 point ROS are negative) ` ` ###########################################`  This patient encounter took 60 minutes today to perform the following: obtain history, perform exam, review outside records, interpret tests & imaging, counsel the patient on their diagnosis; and, document this encounter, including findings & plan in the electronic health record (EHR).   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 metFORMIN HCl (1000MG  Tablet, Oral) Active. Rosuvastatin Calcium (10MG  Tablet, Oral) Active. Benzonatate (100MG  Capsule, Oral) Active. Albuterol Sulfate HFA (108 (90  Base)MCG/ACT Aerosol Soln, Inhalation) Active. Potassium Chloride ER (10MEQ Tablet ER, Oral) Active. Torsemide (20MG  Tablet, Oral) Active. Pantoprazole Sodium (20MG  Tablet DR, Oral) Active. Januvia (100MG  Tablet, Oral) Active. Aspirin Low Dose (81MG  Tablet DR, Oral) Active. ALPRAZolam (0.5MG  Tablet, Oral) Active. Medications Reconciled  Social History Mertha Finders(Kheana Marshall-McBride, CMA; 05/28/2020 3:30 PM) Alcohol use Occasional alcohol use. Caffeine use Coffee, Tea. No drug use Tobacco use Never smoker.  Family History Mertha Finders(Kheana Marshall-McBride, CMA; 05/28/2020 3:30 PM) Alcohol Abuse Brother. Colon Polyps Father. Heart disease in male family member before age 56  Other Problems Mertha Finders(Kheana Marshall-McBride, CMA; 05/28/2020 3:30 PM) Diabetes Mellitus Diverticulosis Gastroesophageal Reflux Disease Migraine Headache     Review of Systems (Kheana Marshall-McBride CMA; 05/28/2020 3:30 PM) General Not Present- Appetite Loss, Chills, Fatigue, Fever, Night Sweats, Weight Gain and Weight Loss. Skin Not Present- Change in Wart/Mole, Dryness, Hives, Jaundice, New Lesions, Non-Healing Wounds, Rash and Ulcer. HEENT Not Present- Earache, Hearing Loss, Hoarseness, Nose Bleed, Oral Ulcers, Ringing in the Ears, Seasonal Allergies, Sinus Pain, Sore Throat, Visual Disturbances, Wears glasses/contact lenses and Yellow Eyes. Respiratory Not Present- Bloody sputum, Chronic Cough, Difficulty Breathing, Snoring and Wheezing. Breast Not Present- Breast Mass, Breast Pain, Nipple Discharge and Skin Changes. Cardiovascular Not Present- Chest Pain, Difficulty Breathing Lying Down, Leg Cramps, Palpitations, Rapid Heart Rate, Shortness of Breath and Swelling of Extremities. Gastrointestinal Present- Constipation and Indigestion. Not Present- Abdominal Pain, Bloating, Bloody Stool, Change in Bowel Habits, Chronic diarrhea, Difficulty Swallowing, Excessive gas, Gets full quickly at meals, Hemorrhoids,  Nausea, Rectal Pain and Vomiting. Male Genitourinary Not Present- Blood in Urine, Change in Urinary Stream, Frequency, Impotence, Nocturia, Painful Urination, Urgency and Urine Leakage.  Vitals (Kheana Marshall-McBride CMA; 05/28/2020 3:35 PM) 05/28/2020 3:33 PM Weight: 327 lb Height: 68in Body Surface Area: 2.52 m Body Mass Index: 49.72 kg/m  Temp.: 97.12F  Pulse: 112 (Regular)  P.OX: 98% (Room air) BP: 142/88(Sitting, Left Arm, Standard)        Physical Exam Dustin Mcintyre(Sherrina Zaugg C. Zonnique Norkus MD; 05/28/2020 5:42 PM)  General Mental Status-Alert. General Appearance-Not in acute distress, Not Sickly. Orientation-Oriented X3. Hydration-Well hydrated. Voice-Normal.  Integumentary Global Assessment Upon inspection and palpation of skin surfaces of the - Axillae: non-tender, no inflammation or ulceration, no drainage. and Distribution of scalp and body hair is normal. General Characteristics Temperature - normal warmth is noted.  Head and Neck Head-normocephalic, atraumatic with no lesions or palpable masses. Face Global Assessment - atraumatic, no absence of expression. Neck Global Assessment - no abnormal movements, no bruit auscultated on the right, no bruit auscultated on the left, no decreased range of motion, non-tender. Trachea-midline. Thyroid Gland Characteristics - non-tender.  Eye Eyeball - Left-Extraocular movements intact, No Nystagmus - Left. Eyeball - Right-Extraocular movements intact, No Nystagmus - Right. Cornea - Left-No Hazy - Left. Cornea - Right-No Hazy - Right. Sclera/Conjunctiva - Left-No scleral icterus, No Discharge - Left. Sclera/Conjunctiva - Right-No scleral icterus, No Discharge - Right. Pupil - Left-Direct reaction to light normal. Pupil - Right-Direct reaction to light normal.  ENMT Ears Pinna - Left - no drainage observed, no generalized tenderness observed. Pinna - Right - no drainage observed, no generalized  tenderness observed. Nose and Sinuses External Inspection of the Nose - no destructive lesion observed. Inspection of the nares - Left - quiet respiration. Inspection of the nares - Right - quiet respiration. Mouth and Throat Lips - Upper Lip - no fissures observed, no pallor noted. Lower Lip - no fissures observed, no pallor noted. Nasopharynx - no discharge present. Oral  Cavity/Oropharynx - Tongue - no dryness observed. Oral Mucosa - no cyanosis observed. Hypopharynx - no evidence of airway distress observed.  Chest and Lung Exam Inspection Movements - Normal and Symmetrical. Accessory muscles - No use of accessory muscles in breathing. Palpation Palpation of the chest reveals - Non-tender. Auscultation Breath sounds - Normal and Clear.  Cardiovascular Auscultation Rhythm - Regular. Murmurs & Other Heart Sounds - Auscultation of the heart reveals - No Murmurs and No Systolic Clicks.  Abdomen Inspection Inspection of the abdomen reveals - No Visible peristalsis and No Abnormal pulsations. Umbilicus - No Bleeding, No Urine drainage. Palpation/Percussion Palpation and Percussion of the abdomen reveal - Soft, Non Tender, No Rebound tenderness, No Rigidity (guarding) and No Cutaneous hyperesthesia. Note: Abdomen morbidly obese but soft. Good hygiene around panniculus. Soft. Minimal discomfort left lower quadrant and left upper quadrant. Not distended. No umbilical or incisional hernias. No guarding.  Male Genitourinary Sexual Maturity Tanner 5 - Adult hair pattern and Adult penile size and shape.  Peripheral Vascular Upper Extremity Inspection - Left - No Cyanotic nailbeds - Left, Not Ischemic. Inspection - Right - No Cyanotic nailbeds - Right, Not Ischemic.  Neurologic Neurologic evaluation reveals -normal attention span and ability to concentrate, able to name objects and repeat phrases. Appropriate fund of knowledge , normal sensation and normal coordination. Mental  Status Affect - not angry, not paranoid. Cranial Nerves-Normal Bilaterally. Gait-Normal.  Neuropsychiatric Mental status exam performed with findings of-able to articulate well with normal speech/language, rate, volume and coherence, thought content normal with ability to perform basic computations and apply abstract reasoning and no evidence of hallucinations, delusions, obsessions or homicidal/suicidal ideation.  Musculoskeletal Global Assessment Spine, Ribs and Pelvis - no instability, subluxation or laxity. Right Upper Extremity - no instability, subluxation or laxity.  Lymphatic Head & Neck  General Head & Neck Lymphatics: Bilateral - Description - No Localized lymphadenopathy. Axillary  General Axillary Region: Bilateral - Description - No Localized lymphadenopathy. Femoral & Inguinal  Generalized Femoral & Inguinal Lymphatics: Left - Description - No Localized lymphadenopathy. Right - Description - No Localized lymphadenopathy.    Assessment & Plan Dustin Sportsman MD; 05/28/2020 4:10 PM)  DIVERTICULITIS, COLON (K57.32) Impression: Recurrent episodes of pain very suspicious for diverticulitis with a documented episode back in September. Sigmoid improbably splenic flexure. Recurrent classic symptoms improved on oral antibiotics concerning for chronic diverticulitis.  I think he would benefit from segmental colonic resection. Most likely will be a left hemicolectomy with splenic flexure mobilization.  Standard care is to get a colonoscopy preoperatively to make sure there are no malignancies or other concerns. I will try to reach out to Dr. wall and see if he is willing to do that or if not if someone in town could do it. Otherwise, at least his air-contrast barium enema was underwhelming in November.  I would like to obtain cardiac clearance given his history of pericardial effusions. He sees a cardiologist in [facility], [place] & is about due for a 6 month  follow-up anyway. Echocardiogram showed decent ejection fraction in the 55-60% region. Hopefully can be cleared.  Patient has gotten coded twice. He refuses to get vaccinated. Last episode in January. His risks of perioperative complications is higher vaccinate patients with infections. He has decent performance status. His blood in his  Current Plans Pt Education - [facility] Diverticular Disease (AT) Instructed to schedule colonoscopywith a gastroenterologist  PREOP COLON - ENCOUNTER FOR PREOPERATIVE EXAMINATION FOR GENERAL SURGICAL PROCEDURE (Z01.818)  Current Plans  You are being scheduled for surgery- Our schedulers will call you.  You should hear from our office's scheduling department within 5 working days about the location, date, and time of surgery. We try to make accommodations for patient's preferences in scheduling surgery, but sometimes the OR schedule or the surgeon's schedule prevents [place] from making those accommodations.  If you have not heard from our office [phone]) in 5 working days, call the office and ask for your surgeon's nurse.  If you have other questions about your diagnosis, plan, or surgery, call the office and ask for your surgeon's nurse.  Written instructions provided The anatomy & physiology of the digestive tract was discussed. The pathophysiology of the colon was discussed. Natural history risks without surgery was discussed. I feel the risks of no intervention will lead to serious problems that outweigh the operative risks; therefore, I recommended a partial colectomy to remove the pathology. Minimally invasive (Robotic/Laparoscopic) & open techniques were discussed.  Risks such as bleeding, infection, abscess, leak, reoperation, possible ostomy, hernia, heart attack, death, and other risks were discussed. I noted a good likelihood this will help address the problem. Goals of post-operative recovery were discussed as well. Need for adequate  nutrition, daily bowel regimen and healthy physical activity, to optimize recovery was noted as well. We will work to minimize complications. Educational materials were available as well. Questions were answered. The patient expresses understanding & wishes to proceed with surgery.  Pt Education - [facility] Colon Bowel Prep 2018 ERAS/Miralax/Antibiotics Pt Education - Pamphlet Given - Laparoscopic Colorectal Surgery: discussed with patient and provided information. Pt Education - [facility] Colectomy post-op instructions: discussed with patient and provided information. Started metroNIDAZOLE 500 MG Oral Tablet, 2 (two) Tablet three times daily, #6, 1 day starting 05/28/2020, No Refill. Local Order: Pharmacist Notes: Pharmacy Instructions: Take 2 tablets at 2pm, 3pm, and 10pm the day prior to your colon operation. Started Neomycin Sulfate 500 MG Oral Tablet, 2 (two) Tablet SEE NOTE, #6, 05/28/2020, No Refill. Local Order: Pharmacist Notes: TAKE TWO TABLETS AT 2 PM, 3 PM, AND 10 PM THE DAY PRIOR TO SURGERY  HISTORY OF COVID-19 (Z86.16) Impression: He's had coded twice. Refuses vaccination. Last episode in January. He is past the seven-week waiting period. Hopefully with Acticoat again. I noted his operative risks are increased with his history of Coumadin and unvaccinated status. We will see him his but it is what it is.   PERICARDIAL EFFUSION (I31.3) Impression: History of pericardial effusion and dyspnea on exertion with echocardiogram underwhelming. Last seen by cardiology in October. About due for 6 month follow-up. I would like cardiac clearance to make sure there are no other concerns  Current Plans I recommended obtaining preoperative cardiac clearance. I am concerned about the health of the patient and the ability to tolerate the operation. Therefore, we will request clearance by cardiology to better assess operative risk & see if a reevaluation, further workup, etc is needed. Also recommendations  on how medications such as for anticoagulation and blood pressure should be managed/held/restarted after surgery.  OBESITY, MORBID, BMI 40.0-49.9 (E66.01)   DIPS TOBACCO (Z72.0) Impression: Try to quit. He's got back to just 1 can a day   DIABETES MELLITUS (E11.9) Impression: Diabetes controlled with oral hyperglycemic. Last A1c less than 8. He has increased operative risk with his diabetes diagnosis probably not prohibitively so  Dustin Sportsman, MD, FACS, MASCRS  Gastrointestinal and Minimally Invasive Surgery  [facility] [address] [place], [place]  [address] [phone] Fax [phone] Main/Paging  CONTACT INFORMATION: Weekday (9AM-5PM) concerns: Call [facility] main office at [phone] Weeknight (5PM-9AM) or Weekend/Holiday concerns: Check [email or web address] for General Surgery [facility] coverage (Please, do not use SecureChat as it is not reliable communication to operating surgeons for immediate patient care)

## 2020-05-29 ENCOUNTER — Telehealth: Payer: Self-pay | Admitting: Cardiology

## 2020-05-29 NOTE — Telephone Encounter (Signed)
LVM for patient to call back and schedule

## 2020-05-29 NOTE — Telephone Encounter (Signed)
   [facility] Pre-operative Risk Assessment    HEARTCARE STAFF: - Please ensure there is not already an duplicate clearance open for this procedure. - Under Visit Info/Reason for Call, type in Other and utilize the format Clearance MM/DD/YY or Clearance TBD. Do not use dashes or single digits. - If request is for dental extraction, please clarify the # of teeth to be extracted.  Request for surgical clearance:  1. What type of surgery is being performed? Robotic left hem. Colectomy surgery  2. When is this surgery scheduled? TBD  3. What type of clearance is required (medical clearance vs. Pharmacy clearance to hold med vs. Both)? both  4. Are there any medications that need to be held prior to surgery and how long? Not listed, please advise if needed  5. Practice name and name of physician performing surgery? [facility] - Dr Dustin Mcintyre  6. What is the office phone number? [phone]   7.   What is the office fax number? [place]   Anesthesia type (None, local, MAC, general) ? General    Dustin Mcintyre 05/29/2020, 9:29 AM  _________________________________________________________________   (provider comments below)

## 2020-05-29 NOTE — Telephone Encounter (Signed)
   Primary Cardiologist: Debbe Odea, MD  Chart reviewed as part of pre-operative protocol coverage. Because of Dustin Mcintyre's past medical history and time since last visit, he will require a follow-up visit in order to better assess preoperative cardiovascular risk.  Pre-op covering staff: - Please schedule appointment and call patient to inform them. If patient already had an upcoming appointment within acceptable timeframe, please add "pre-op clearance" to the appointment notes so provider is aware. - Please contact requesting surgeon's office via preferred method (i.e, phone, fax) to inform them of need for appointment prior to surgery.  If applicable, this message will also be routed to pharmacy pool and/or primary cardiologist for input on holding anticoagulant/antiplatelet agent as requested below so that this information is available to the clearing provider at time of patient's appointment.   Georgie Chard, NP  05/29/2020, 10:06 AM

## 2020-05-30 NOTE — Telephone Encounter (Signed)
Scheduled.  Patient can only do Monday appt due to work schedule.

## 2020-05-30 NOTE — Telephone Encounter (Signed)
DPR ok to s/w the pt's wife Dustin Mcintyre. Dustin Mcintyre made pre op appt for the pt. 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 Per pt's wife request appt to be best if 06/18/20 if available. Pt has been scheduled to see Gillian Shields, NP 4/18 @ 8 am. Pt's wife thanked me for the call and the help. Will send note to NP for upcoming appt. Will send FYI to requesting office pt has appt 06/18/20. Will remove from the pre op call back pool.

## 2020-05-30 NOTE — Telephone Encounter (Signed)
Patient seen 12/2019 by Dr. Azucena Cecil due to pericardial effusion. Dr. Azucena Cecil recommended repeat echocardiogram in 6 months which has been ordered but not scheduled. Will route to our [facility] scheduling team to see if we can have his echocardiogram collected at least 1 day prior to his upcoming appointment to review at that time.   Alver Sorrow, NP

## 2020-06-05 ENCOUNTER — Encounter: Payer: Self-pay | Admitting: *Deleted

## 2020-06-18 ENCOUNTER — Ambulatory Visit: Payer: No Typology Code available for payment source | Admitting: Family

## 2020-07-02 DIAGNOSIS — E1169 Type 2 diabetes mellitus with other specified complication: Secondary | ICD-10-CM | POA: Diagnosis not present

## 2020-07-02 DIAGNOSIS — E119 Type 2 diabetes mellitus without complications: Secondary | ICD-10-CM | POA: Diagnosis not present

## 2020-07-02 DIAGNOSIS — K5732 Diverticulitis of large intestine without perforation or abscess without bleeding: Secondary | ICD-10-CM | POA: Diagnosis not present

## 2020-07-16 ENCOUNTER — Other Ambulatory Visit: Payer: No Typology Code available for payment source

## 2020-07-23 ENCOUNTER — Ambulatory Visit: Payer: No Typology Code available for payment source | Admitting: Cardiology

## 2020-08-20 ENCOUNTER — Other Ambulatory Visit: Payer: No Typology Code available for payment source

## 2020-08-21 ENCOUNTER — Other Ambulatory Visit: Payer: No Typology Code available for payment source

## 2020-08-27 ENCOUNTER — Ambulatory Visit: Payer: No Typology Code available for payment source | Admitting: Cardiology

## 2020-08-30 ENCOUNTER — Other Ambulatory Visit: Payer: No Typology Code available for payment source

## 2020-08-30 ENCOUNTER — Ambulatory Visit: Payer: No Typology Code available for payment source | Admitting: Cardiology

## 2020-09-04 ENCOUNTER — Ambulatory Visit: Payer: No Typology Code available for payment source | Admitting: Cardiology

## 2020-09-11 ENCOUNTER — Other Ambulatory Visit: Payer: No Typology Code available for payment source

## 2020-09-18 ENCOUNTER — Ambulatory Visit: Payer: No Typology Code available for payment source | Admitting: Cardiology

## 2020-12-17 ENCOUNTER — Other Ambulatory Visit: Payer: Self-pay

## 2020-12-17 ENCOUNTER — Telehealth: Payer: Self-pay | Admitting: Cardiology

## 2020-12-17 DIAGNOSIS — R0989 Other specified symptoms and signs involving the circulatory and respiratory systems: Secondary | ICD-10-CM

## 2020-12-17 MED ORDER — TORSEMIDE 20 MG PO TABS: 20.0000 mg | ORAL_TABLET | Freq: Every day | ORAL | 0 refills | Status: AC

## 2020-12-17 MED ORDER — POTASSIUM CHLORIDE ER 10 MEQ PO TBCR: 10.0000 meq | EXTENDED_RELEASE_TABLET | Freq: Every day | ORAL | 0 refills | Status: AC

## 2020-12-17 NOTE — Telephone Encounter (Signed)
Attempted to schedule no ans no vm  

## 2020-12-17 NOTE — Telephone Encounter (Signed)
-----   Message from Andi Devon sent at 12/17/2020 10:07 AM EDT ----- Regarding: needs appointment Please schedule overdue F/U appointment for refills. Thank you!

## 2021-07-02 NOTE — Telephone Encounter (Signed)
Attempted to schedule.  LMOV to call office.  ° °

## 2021-07-22 NOTE — Telephone Encounter (Signed)
3 attempts to schedule fu appt from recall list.   Deleting recall.   

## 2021-07-22 NOTE — Telephone Encounter (Signed)
Unable to reach .  Closing encounter.  

## 2021-12-30 ENCOUNTER — Encounter (INDEPENDENT_AMBULATORY_CARE_PROVIDER_SITE_OTHER): Payer: Self-pay

## 2022-06-23 ENCOUNTER — Observation Stay
Admission: EM | Admit: 2022-06-23 | Discharge: 2022-06-24 | Disposition: A | Discharge status: Home / Self Care | Payer: No Typology Code available for payment source | Attending: Internal Medicine | Admitting: Internal Medicine

## 2022-06-23 ENCOUNTER — Other Ambulatory Visit: Payer: Self-pay

## 2022-06-23 ENCOUNTER — Emergency Department: Payer: No Typology Code available for payment source

## 2022-06-23 DIAGNOSIS — F17290 Nicotine dependence, other tobacco product, uncomplicated: Secondary | ICD-10-CM | POA: Insufficient documentation

## 2022-06-23 DIAGNOSIS — K921 Melena: Secondary | ICD-10-CM

## 2022-06-23 DIAGNOSIS — Z79899 Other long term (current) drug therapy: Secondary | ICD-10-CM | POA: Insufficient documentation

## 2022-06-23 DIAGNOSIS — Z794 Long term (current) use of insulin: Secondary | ICD-10-CM | POA: Insufficient documentation

## 2022-06-23 DIAGNOSIS — Z6841 Body Mass Index (BMI) 40.0 and over, adult: Secondary | ICD-10-CM | POA: Insufficient documentation

## 2022-06-23 DIAGNOSIS — Z7984 Long term (current) use of oral hypoglycemic drugs: Secondary | ICD-10-CM | POA: Insufficient documentation

## 2022-06-23 DIAGNOSIS — E876 Hypokalemia: Secondary | ICD-10-CM | POA: Insufficient documentation

## 2022-06-23 DIAGNOSIS — R1013 Epigastric pain: Secondary | ICD-10-CM | POA: Diagnosis not present

## 2022-06-23 DIAGNOSIS — R195 Other fecal abnormalities: Secondary | ICD-10-CM

## 2022-06-23 DIAGNOSIS — K56609 Unspecified intestinal obstruction, unspecified as to partial versus complete obstruction: Secondary | ICD-10-CM | POA: Insufficient documentation

## 2022-06-23 DIAGNOSIS — E119 Type 2 diabetes mellitus without complications: Secondary | ICD-10-CM

## 2022-06-23 DIAGNOSIS — Z7982 Long term (current) use of aspirin: Secondary | ICD-10-CM | POA: Insufficient documentation

## 2022-06-23 DIAGNOSIS — E1165 Type 2 diabetes mellitus with hyperglycemia: Secondary | ICD-10-CM | POA: Insufficient documentation

## 2022-06-23 DIAGNOSIS — K922 Gastrointestinal hemorrhage, unspecified: Secondary | ICD-10-CM | POA: Insufficient documentation

## 2022-06-23 LAB — COMPREHENSIVE METABOLIC PANEL
ALT: 34 U/L (ref 0–44)
AST: 39 U/L (ref 15–41)
Albumin: 3.7 g/dL (ref 3.5–5.0)
Alkaline Phosphatase: 113 U/L (ref 38–126)
Anion gap: 8 (ref 5–15)
BUN: 13 mg/dL (ref 6–20)
CO2: 23 mmol/L (ref 22–32)
Calcium: 7.1 mg/dL — ABNORMAL LOW (ref 8.9–10.3)
Chloride: 107 mmol/L (ref 98–111)
Creatinine, Ser: 0.62 mg/dL (ref 0.61–1.24)
GFR, Estimated: >60 mL/min (ref >60)
Glucose, Bld: 152 mg/dL — ABNORMAL HIGH (ref 70–99)
Potassium: 3.5 mmol/L (ref 3.5–5.1)
Sodium: 138 mmol/L (ref 135–145)
Total Bilirubin: 0.3 mg/dL (ref 0.3–1.2)
Total Protein: 6.8 g/dL (ref 6.5–8.1)

## 2022-06-23 LAB — CBC WITH DIFFERENTIAL/PLATELET
Abs Immature Granulocytes: 0.05 10*3/uL (ref 0.00–0.07)
Basophils Absolute: 0.1 10*3/uL (ref 0.0–0.1)
Basophils Relative: 1 %
Eosinophils Absolute: 0.2 10*3/uL (ref 0.0–0.5)
Eosinophils Relative: 2 %
HCT: 39.7 % (ref 39.0–52.0)
Hemoglobin: 12.4 g/dL — ABNORMAL LOW (ref 13.0–17.0)
Immature Granulocytes: 1 %
Lymphocytes Relative: 22 %
Lymphs Abs: 2.1 10*3/uL (ref 0.7–4.0)
MCH: 27.3 pg (ref 26.0–34.0)
MCHC: 31.2 g/dL (ref 30.0–36.0)
MCV: 87.4 fL (ref 80.0–100.0)
Monocytes Absolute: 1 10*3/uL (ref 0.1–1.0)
Monocytes Relative: 11 %
Neutro Abs: 5.9 10*3/uL (ref 1.7–7.7)
Neutrophils Relative %: 63 %
Platelets: 334 10*3/uL (ref 150–400)
RBC: 4.54 MIL/uL (ref 4.22–5.81)
RDW: 14.1 % (ref 11.5–15.5)
WBC: 9.3 10*3/uL (ref 4.0–10.5)
nRBC: 0 % (ref 0.0–0.2)

## 2022-06-23 LAB — C DIFFICILE QUICK SCREEN W PCR REFLEX
C Diff antigen: NEGATIVE
C Diff interpretation: NOT DETECTED
C Diff toxin: NEGATIVE

## 2022-06-23 LAB — GLUCOSE, CAPILLARY: Glucose-Capillary: 142 mg/dL — ABNORMAL HIGH (ref 70–99)

## 2022-06-23 LAB — LIPASE, BLOOD: Lipase: 32 U/L (ref 11–51)

## 2022-06-23 MED ORDER — SODIUM CHLORIDE 0.9% FLUSH
3.0000 mL | Freq: Two times a day (BID) | INTRAVENOUS | Status: DC
  Administered 2022-06-24: 3 mL via INTRAVENOUS

## 2022-06-23 MED ORDER — PANTOPRAZOLE SODIUM 40 MG IV SOLR
40.0000 mg | Freq: Once | INTRAVENOUS | Status: AC
  Administered 2022-06-23: 40 mg via INTRAVENOUS
  Filled 2022-06-23: qty 10

## 2022-06-23 MED ORDER — DIAZEPAM 5 MG/ML IJ SOLN
5.0000 mg | Freq: Once | INTRAMUSCULAR | Status: DC
  Filled 2022-06-23: qty 2

## 2022-06-23 MED ORDER — SODIUM CHLORIDE 0.9 % IV BOLUS
1000.0000 mL | Freq: Once | INTRAVENOUS | Status: AC
  Administered 2022-06-23: 1000 mL via INTRAVENOUS

## 2022-06-23 MED ORDER — IOHEXOL 350 MG/ML SOLN
125.0000 mL | Freq: Once | INTRAVENOUS | Status: AC | PRN
  Administered 2022-06-23: 125 mL via INTRAVENOUS

## 2022-06-23 MED ORDER — ACETAMINOPHEN 650 MG RE SUPP: 650.0000 mg | Freq: Four times a day (QID) | RECTAL | Status: DC | PRN

## 2022-06-23 MED ORDER — LACTATED RINGERS IV SOLN: INTRAVENOUS | Status: AC

## 2022-06-23 MED ORDER — PANTOPRAZOLE SODIUM 40 MG IV SOLR
40.0000 mg | Freq: Two times a day (BID) | INTRAVENOUS | Status: DC
  Administered 2022-06-24: 40 mg via INTRAVENOUS
  Filled 2022-06-23: qty 10

## 2022-06-23 MED ORDER — LIDOCAINE HCL URETHRAL/MUCOSAL 2 % EX GEL
1.0000 | Freq: Once | CUTANEOUS | Status: DC
  Filled 2022-06-23: qty 6

## 2022-06-23 MED ORDER — LIDOCAINE HCL 4 % EX SOLN
Freq: Once | CUTANEOUS | Status: DC
  Filled 2022-06-23: qty 50

## 2022-06-23 MED ORDER — ACETAMINOPHEN 325 MG PO TABS: 650.0000 mg | ORAL_TABLET | Freq: Four times a day (QID) | ORAL | Status: DC | PRN

## 2022-06-23 MED ORDER — ONDANSETRON HCL 4 MG/2ML IJ SOLN: 4.0000 mg | Freq: Four times a day (QID) | INTRAMUSCULAR | Status: DC | PRN

## 2022-06-23 MED ORDER — INSULIN ASPART 100 UNIT/ML IJ SOLN
0.0000 [IU] | Freq: Three times a day (TID) | INTRAMUSCULAR | Status: DC
  Administered 2022-06-24: 2 [IU] via SUBCUTANEOUS
  Filled 2022-06-23: qty 1

## 2022-06-23 MED ORDER — MORPHINE SULFATE (PF) 4 MG/ML IV SOLN
4.0000 mg | Freq: Once | INTRAVENOUS | Status: AC
  Administered 2022-06-23: 4 mg via INTRAVENOUS
  Filled 2022-06-23: qty 1

## 2022-06-23 MED ORDER — ONDANSETRON HCL 4 MG/2ML IJ SOLN
4.0000 mg | Freq: Once | INTRAMUSCULAR | Status: AC
  Administered 2022-06-23: 4 mg via INTRAVENOUS
  Filled 2022-06-23: qty 2

## 2022-06-23 NOTE — Assessment & Plan Note (Addendum)
Currently on Metformin and Januvia  - A1c pending - SSI, moderate

## 2022-06-23 NOTE — ED Triage Notes (Signed)
Pt presents to ED from home accompanied by spouse C/O central abdominal pain, n/v/d. Denies blood in emesis or BM. Reports taking ibuprofen and excedrin together daily X years. Reports takes 800 mg Ibuprofen and 1000 mg Excedrin every 6 hours for chronic pain. Hx diverticulitis, takes chronic Augmentin and Flagyl.

## 2022-06-23 NOTE — Assessment & Plan Note (Addendum)
Patient is presenting with 3-day history of epigastric abdominal pain with distention, nausea with vomiting (no additional episodes of vomiting in > 24 hours), and foul-smelling stool.  CT imaging obtained demonstrates dilated proximal small bowel loops that is nonspecific but concerning for possible early or partial SBO.  [name] history is not completely consistent with SBO and so general surgery was consulted.  Differential also includes enteritis.   - General surgery consulted; appreciate their recommendations - IV fluids - Zofran as needed for nausea - Will plan to place NG tube if patient's vomiting reoccurs - N.p.o.

## 2022-06-23 NOTE — Assessment & Plan Note (Addendum)
Patient reported melena that occurred 5 days ago without recurrence since.  Per EDP, FOBT is positive.  Patient takes high doses of NSAIDs daily placing him at risk for peptic ulcer disease versus gastritis.  We have discussed this and I have explained that continued use of NSAIDs will place him at high risk for GI bleeding.   - Start Protonix 40 mg IV twice daily - Encouraged no additional use of NSAIDs - If additional episodes of melena occur, consider consulting GI

## 2022-06-23 NOTE — H&P (Addendum)
History and Physical    Patient: Dustin Mcintyre XBJ:[id number] DOB: 10-Apr-1965 DOA: 06/23/2022 DOS: the patient was seen and examined on 06/23/2022 PCP: Theadore Nan, NP  Patient coming from: Home  Chief Complaint:  Chief Complaint  Patient presents with   Abdominal Pain   HPI: Dustin Mcintyre is a 57 y.o. male with medical history significant of recurrent diverticulitis, Type 2 diabetes mellitus, hyperlipidemia who presents to the ED due to abdominal pain.  Dustin Mcintyre states that on the evening of 4/19, he developed rapid onset epigastric abdominal pain with distention.  He states that the abdominal pain is waxing and waning with severity.  In addition, he was experiencing nausea with several episodes of vomiting.  Since that time, he has been able to hold down liquids and minimal solids.  He has not had any additional vomiting since the early morning hours of 4/20.  He has continued to have bowel movements but states they have been much smaller in quantity and have been green with a foul odor.  Due to concern that this may be a diverticulitis flare, he began taking Augmentin and metronidazole on Friday evening but stopped after his stool became green as he was concerned for possible C. difficile.  He endorses hot and cold sweats but denies any known fever or chills.  He denies any chest pain or palpitations.  Since his abdomen has been distended, he endorses some increased work of breathing.  Of note, patient states that he had black stool approximately 5 days ago but this has not recurred since.  He notes that he takes at least 4 to 8 tablets of ibuprofen daily in addition to Excedrin every 6 hours.  He has taken more since his abdominal symptoms started.  ED course: On arrival to the ED, patient was normotensive at 144/78 with heart rate of 88.  He was saturating at 98% on room air.  He was afebrile at 97.7.  Initial workup notable for WBC of 9.3, hemoglobin 12.4, platelets 334,  potassium 3.5, glucose 152, creatinine 0.62, and GFR above 60. CT of the abdomen was obtained that demonstrated mildly dilated proximal small bowel with gradual transition to more decompressed distal small bowel that is non-specific but concerning for early or partial SBO.  [facility] contacted for admission.  Review of Systems: As mentioned in the history of present illness. All other systems reviewed and are negative.  Past Medical History:  Diagnosis Date   Diverticulitis    Hay fever    History reviewed. No pertinent surgical history.  Social History:  reports that he has never smoked. His smokeless tobacco use includes chew. He reports current alcohol use of about 28.0 standard drinks of alcohol per week. He reports that he does not use drugs.  No Known Allergies  Family History  Problem Relation Age of Onset   Hypertension Father    Hypertension Paternal Uncle     Prior to Admission medications   Medication Sig Start Date End Date Taking? Authorizing Provider  albuterol (VENTOLIN HFA) 108 (90 Base) MCG/ACT inhaler Inhale 2 puffs into the lungs every 4 (four) hours as needed for wheezing or shortness of breath. 04/02/20   Massie Maroon, FNP  amoxicillin-clavulanate (AUGMENTIN) 875-125 MG tablet Take 1 tablet by mouth 2 (two) times daily. 03/28/20   Midge Minium, MD  Ascorbic Acid (VITAMIN C) 1000 MG tablet Take 1,000 mg by mouth daily.    [provider]  aspirin EC 81 MG tablet Take 1 tablet (  81 mg total) by mouth daily. Swallow whole. 01/12/20   Theadore Nan, NP  benzonatate (TESSALON) 100 MG capsule Take 1 capsule (100 mg total) by mouth 3 (three) times daily as needed for cough. 03/26/20   Freddy Finner, NP  blood glucose meter kit and supplies Dispense based on patient and insurance preference. Use up to four times daily as directed. (FOR ICD-10 E10.9, E11.9). 04/23/18   Guse, Janna Arch, FNP  Boswellia-Glucosamine-Vit D (OSTEO BI-FLEX ONE PER DAY PO) Take by mouth.     [provider]  dextromethorphan 15 MG/5ML syrup Take 10 mLs (30 mg total) by mouth 4 (four) times daily as needed for cough. 04/02/20   Massie Maroon, FNP  fexofenadine (ALLEGRA) 180 MG tablet Take 180 mg by mouth daily.    [provider]  Garlic 1000 MG CAPS Take 1,000 mg by mouth.    [provider]  glucose blood test strip Used to check blood sugars four times daily. 01/11/19   Guse, Janna Arch, FNP  ibuprofen (ADVIL,MOTRIN) 800 MG tablet Take 800 mg by mouth every 8 (eight) hours as needed.    [provider]  Insulin Pen Needle (PEN NEEDLES 29GX1/2") 29G X MISC Use daily with victoza 05/04/18   Guse, Janna Arch, FNP  metFORMIN (GLUCOPHAGE) 1000 MG tablet Take 1 tablet (1,000 mg total) by mouth 2 (two) times daily with a meal. 07/25/19   Theadore Nan, NP  metroNIDAZOLE (FLAGYL) 500 MG tablet Take 1 tablet (500 mg total) by mouth 3 (three) times daily. 03/28/20   Midge Minium, MD  Multiple Vitamin (MULTIVITAMIN) tablet Take 1 tablet by mouth daily.    [provider]  Omega-3 Fatty Acids (FISH OIL) 1000 MG CAPS Take 1,000 mg by mouth 2 (two) times daily.    [provider]  omeprazole (PRILOSEC) 20 MG capsule Take 20 mg by mouth daily.     [provider]  potassium chloride (KLOR-CON) 10 MEQ tablet Take 1 tablet (10 mEq total) by mouth daily. PLEASE SCHEDULE OFFICE VISIT FOR FURTHER REFILLS. THANK YOU! 12/17/20 03/17/21  Debbe Odea, MD  predniSONE (DELTASONE) 20 MG tablet Take 1 tablet (20 mg total) by mouth daily with breakfast. 04/02/20   Massie Maroon, FNP  Probiotic Product (PROBIOTIC ADVANCED PO) Take by mouth.    [provider]  rosuvastatin (CRESTOR) 10 MG tablet Take 1 tablet (10 mg total) by mouth daily. 01/12/20   Theadore Nan, NP  sitaGLIPtin (JANUVIA) 100 MG tablet TAKE 1 TABLET(100 MG) BY MOUTH DAILY 03/12/20   Allegra Grana, FNP  torsemide (DEMADEX) 20 MG tablet Take 1 tablet (20 mg  total) by mouth daily. PLEASE SCHEDULE OFFICE VISIT FOR FURTHER REFILLS. THANK YOU! 12/17/20 03/17/21  Debbe Odea, MD    Physical Exam: Vitals:   06/23/22 1641 06/23/22 1644 06/23/22 2205 06/23/22 2306  BP: (!) 144/78  117/73 (!) 111/49  Pulse: 88  (!) 112 80  Resp: Temp: 97.7 F (36.5 C)  98.2 F (36.8 C) 97.9 F (36.6 C)  TempSrc: Oral  Oral   SpO2: 98%  98% 99%  Weight:  (!) 154.2 kg    Height:   (1.727 m)     Physical Exam Vitals and nursing note reviewed.  Constitutional:      General: He is not in acute distress.    Appearance: He is obese.  HENT:     Head: Normocephalic and atraumatic.  Mouth/Throat:     Mouth: Mucous membranes are moist.     Pharynx: Oropharynx is clear.  Eyes:     Extraocular Movements: Extraocular movements intact.     Conjunctiva/sclera: Conjunctivae normal.  Cardiovascular:     Rate and Rhythm: Normal rate and regular rhythm.     Heart sounds: No murmur heard. Pulmonary:     Effort: Pulmonary effort is normal. No respiratory distress.     Breath sounds: Normal breath sounds. No wheezing, rhonchi or rales.  Abdominal:     General: Bowel sounds are normal. There is distension.     Palpations: Abdomen is soft.     Tenderness: There is abdominal tenderness in the epigastric area. There is no guarding.     Hernia: No hernia is present.  Musculoskeletal:     Right lower leg: No edema.     Left lower leg: No edema.  Skin:    General: Skin is warm and dry.  Neurological:     General: No focal deficit present.     Mental Status: He is alert and oriented to person, place, and time.  Psychiatric:        Mood and Affect: Mood normal.        Behavior: Behavior normal.    Data Reviewed: CBC with WBC of 9.3, hemoglobin 12.4, MCV 87 and platelets of 334 CMP with sodium of 138, potassium 3.5, bicarb 23, glucose 152, BUN 13, creatinine 0.62, calcium 7.1, anion gap of 8, AST 39, ALT 34 and GFR above 60 C. difficile antigen  and toxin negative  CT ABDOMEN PELVIS W CONTRAST  Result Date: 06/23/2022 CLINICAL DATA:  Abdominal pain EXAM: CT ABDOMEN AND PELVIS WITH CONTRAST TECHNIQUE: Multidetector CT imaging of the abdomen and pelvis was performed using the standard protocol following bolus administration of intravenous contrast. RADIATION DOSE REDUCTION: This exam was performed according to the departmental dose-optimization program which includes automated exposure control, adjustment of the mA and/or kV according to patient size and/or use of iterative reconstruction technique. CONTRAST:  OMNIPAQUE IOHEXOL 350 MG/ML SOLN COMPARISON:  CT abdomen and pelvis dated November 25, 2019 FINDINGS: Lower chest: No acute abnormality. Hepatobiliary: Indeterminate irregular low-attenuation lesion of the right lobe of the liver measuring 3.2 x 2.6 cm. Gallbladder is unremarkable. No biliary ductal dilation. Pancreas: Unremarkable. No pancreatic ductal dilatation or surrounding inflammatory changes. Spleen: Normal in size without focal abnormality. Adrenals/Urinary Tract: Adrenal glands are unremarkable. Kidneys are normal, without renal calculi, focal lesion, or hydronephrosis. Bladder is unremarkable. Stomach/Bowel: Mildly dilated proximal small bowel loops which gradually transition to more decompressed distal small bowel loops. Stomach is within normal limits. Appendix appears normal. Diverticulosis. No evidence of wall thickening or inflammatory change. Vascular/Lymphatic: Aortic atherosclerosis. No enlarged abdominal or pelvic lymph nodes. Reproductive: Prostate is unremarkable. Other: No abdominal wall hernia or abnormality. No abdominopelvic ascites. Musculoskeletal: No acute or significant osseous findings. IMPRESSION: 1. Mildly dilated proximal small bowel loops which gradually transition to more decompressed distal small bowel loops, findings are nonspecific but could be due to early or partial small bowel obstruction. 2.  Diverticulosis with no evidence of diverticulitis. 3. Indeterminate irregular low-attenuation lesion of the right lobe of the liver. Recommend nonemergent liver protocol MRI for further evaluation. 4. Aortic Atherosclerosis (ICD10-I70.0). Electronically Signed   By: Allegra Lai M.D.   On: 06/23/2022 19:39    There are no new results to review at this time.  Assessment and Plan:  * Epigastric abdominal pain Patient is presenting with  3-day history of epigastric abdominal pain with distention, nausea with vomiting (no additional episodes of vomiting in > 24 hours), and foul-smelling stool.  CT imaging obtained demonstrates dilated proximal small bowel loops that is nonspecific but concerning for possible early or partial SBO.  [name] history is not completely consistent with SBO and so general surgery was consulted.  Differential also includes enteritis.   - General surgery consulted; appreciate their recommendations - IV fluids - Zofran as needed for nausea - Will plan to place NG tube if patient's vomiting reoccurs - N.p.o.  Melena Patient reported melena that occurred 5 days ago without recurrence since.  Per EDP, FOBT is positive.  Patient takes high doses of NSAIDs daily placing him at risk for peptic ulcer disease versus gastritis.  We have discussed this and I have explained that continued use of NSAIDs will place him at high risk for GI bleeding.   - Start Protonix 40 mg IV twice daily - Encouraged no additional use of NSAIDs - If additional episodes of melena occur, consider consulting GI  Diabetes mellitus Currently on Metformin and Januvia  - A1c pending - SSI, moderate  Advance Care Planning:   Code Status: Full Code verified by patient  Consults: General surgery  Family Communication: [name] wife updated at bedside  Severity of Illness: The appropriate patient status for this patient is OBSERVATION. Observation status is judged to be reasonable and necessary  in order to provide the required intensity of service to ensure the patient's safety. The patient's presenting symptoms, physical exam findings, and initial radiographic and laboratory data in the context of their medical condition is felt to place them at decreased risk for further clinical deterioration. Furthermore, it is anticipated that the patient will be medically stable for discharge from the hospital within 2 midnights of admission.   Author: Verdene Lennert, MD 06/23/2022 11:27 PM  For on call review [email or web address].

## 2022-06-23 NOTE — ED Provider Notes (Addendum)
[facility] Provider Note    Event Date/Time   First MD Initiated Contact with Patient 06/23/22 1813     (approximate)   History   Chief Complaint: Abdominal Pain   HPI  Dustin Mcintyre is a 57 y.o. male with a history of diabetes, hyperlipidemia, obesity, diverticulitis who comes the ED complaining of central abdominal pain and diarrhea, worsening over the past few days.  He had recently been taking Augmentin for diverticulitis and then developed frequent runny diarrhea.  Abdominal pain is generalized, worse with standing and walking and moving.  Also has abdominal distention, loss of appetite, inability to eat for the last few days and a few episodes of vomiting.  Also reports fever at home.  Patient also notes that he takes ibuprofen and Aleve multiple times a day chronically.  Denies hematemesis or bloody stool or melena.     Physical Exam   Triage Vital Signs: ED Triage Vitals  Enc Vitals Group     BP 06/23/22 1641 (!) 144/78     Pulse Rate 06/23/22 1641 88     Resp 06/23/22 1641 20     Temp 06/23/22 1641 97.7 F (36.5 C)     Temp Source 06/23/22 1641 Oral     SpO2 06/23/22 1641 98 %     Weight 06/23/22 1644 (!) 340 lb (154.2 kg)     Height 06/23/22 1644  (1.727 m)     Head Circumference --      Peak Flow --      Pain Score 06/23/22 1644 10     Pain Loc --      Pain Edu? --      Excl. in GC? --     Most recent vital signs: Vitals:   06/23/22 1641  BP: (!) 144/78  Pulse: 88  Resp: 20  Temp: 97.7 F (36.5 C)  SpO2: 98%    General: Awake, no distress.  CV:  Good peripheral perfusion.  Regular rate and rhythm Resp:  Normal effort.  Clear to auscultation bilaterally Abd:  Moderately distended and tympanitic.  No peritonitis or focal tenderness.  Rectal exam reveals runny brown stool, Hemoccult positive Other:  Normal gait   ED Results / Procedures / Treatments   Labs (all labs ordered are listed, but only abnormal results  are displayed) Labs Reviewed  COMPREHENSIVE METABOLIC PANEL - Abnormal; Notable for the following components:      Result Value   Glucose, Bld 152 (*)    Calcium 7.1 (*)    All other components within normal limits  CBC WITH DIFFERENTIAL/PLATELET - Abnormal; Notable for the following components:   Hemoglobin 12.4 (*)    All other components within normal limits  C DIFFICILE QUICK SCREEN W PCR REFLEX    LIPASE, BLOOD     EKG    RADIOLOGY CT abdomen pelvis interpreted by me, negative for abdominal free air.  There are dilated loops of bowel concerning for SBO.  Radiology report reviewed   PROCEDURES:  Procedures   MEDICATIONS ORDERED IN ED: Medications  sodium chloride 0.9 % bolus 1,000 mL (1,000 mLs Intravenous New Bag/Given 06/23/22 1843)  morphine (PF) 4 MG/ML injection 4 mg (4 mg Intravenous Given 06/23/22 1844)  ondansetron (ZOFRAN) injection 4 mg (4 mg Intravenous Given 06/23/22 1844)  pantoprazole (PROTONIX) injection 40 mg (40 mg Intravenous Given 06/23/22 1845)  iohexol (OMNIPAQUE) 350 MG/ML injection 125 mL (125 mLs Intravenous Contrast Given 06/23/22 1907)     IMPRESSION /  MDM / ASSESSMENT AND PLAN / ED COURSE  I reviewed the triage vital signs and the nursing notes.  DDx: Diverticulitis, GI bleed, GI perforation, bowel obstruction, intra-abdominal abscess  [name] presentation is most consistent with acute presentation with potential threat to life or bodily function.  Patient presents with abdominal pain, distention, and occult GI bleed.  Labs are okay.  Will give Protonix, morphine, Zofran, IV fluids, obtain CT scan  ----------------------------------------- 8:20 PM on 06/23/2022 ----------------------------------------- CT concerning for small bowel obstruction.  C. difficile negative.  Will plan for admission with NG tube due to his severe pain and comorbidities.   ----------------------------------------- 9:20 PM on  06/23/2022 ----------------------------------------- Case discussed with hospitalist.  Patient did not tolerate first attempt to place NG tube.  He was agreeable to try again with topical lidocaine and Valium.  ----------------------------------------- 9:58 PM on 06/23/2022 ----------------------------------------- In discussion with hospitalist, patient decided to defer NG tube placement.  They will continue to monitor symptoms for vomiting      FINAL CLINICAL IMPRESSION(S) / ED DIAGNOSES   Final diagnoses:  SBO (small bowel obstruction)  Occult GI bleeding  Type 2 diabetes mellitus with hyperglycemia, with long-term current use of insulin  Morbid obesity     Rx / DC Orders   ED Discharge Orders     None        Note:  This document was prepared using Dragon voice recognition software and may include unintentional dictation errors.   Sharman Cheek, MD 06/23/22 2120    Sharman Cheek, MD 06/23/22 2159

## 2022-06-23 NOTE — ED Notes (Signed)
Iv started and meds given.   Siderails up x 2

## 2022-06-23 NOTE — ED Notes (Signed)
This Rn attempted to pass the NGTube.  Pt refused due to discomfort.  Dr Scotty Court aware.

## 2022-06-23 NOTE — ED Notes (Signed)
Per dr Huel Cote, pt has had no vomiting during past 24 hours, so no NGTube at this time.

## 2022-06-23 NOTE — ED Notes (Signed)
Pt resting comfortably  family with pt.  Pt alert.

## 2022-06-24 DIAGNOSIS — R1013 Epigastric pain: Secondary | ICD-10-CM | POA: Diagnosis not present

## 2022-06-24 DIAGNOSIS — E1169 Type 2 diabetes mellitus with other specified complication: Secondary | ICD-10-CM | POA: Diagnosis not present

## 2022-06-24 DIAGNOSIS — E876 Hypokalemia: Secondary | ICD-10-CM

## 2022-06-24 LAB — CBC
HCT: 34.1 % — ABNORMAL LOW (ref 39.0–52.0)
Hemoglobin: 10.7 g/dL — ABNORMAL LOW (ref 13.0–17.0)
MCH: 27.6 pg (ref 26.0–34.0)
MCHC: 31.4 g/dL (ref 30.0–36.0)
MCV: 87.9 fL (ref 80.0–100.0)
Platelets: 265 10*3/uL (ref 150–400)
RBC: 3.88 MIL/uL — ABNORMAL LOW (ref 4.22–5.81)
RDW: 14.2 % (ref 11.5–15.5)
WBC: 9.2 10*3/uL (ref 4.0–10.5)
nRBC: 0 % (ref 0.0–0.2)

## 2022-06-24 LAB — HEMOGLOBIN A1C
Hgb A1c MFr Bld: 7.8 % — ABNORMAL HIGH (ref 4.8–5.6)
Mean Plasma Glucose: 177.16 mg/dL

## 2022-06-24 LAB — BASIC METABOLIC PANEL
Anion gap: 7 (ref 5–15)
BUN: 10 mg/dL (ref 6–20)
CO2: 24 mmol/L (ref 22–32)
Calcium: 6.7 mg/dL — ABNORMAL LOW (ref 8.9–10.3)
Chloride: 107 mmol/L (ref 98–111)
Creatinine, Ser: 0.54 mg/dL — ABNORMAL LOW (ref 0.61–1.24)
GFR, Estimated: >60 mL/min (ref >60)
Glucose, Bld: 130 mg/dL — ABNORMAL HIGH (ref 70–99)
Potassium: 3.1 mmol/L — ABNORMAL LOW (ref 3.5–5.1)
Sodium: 138 mmol/L (ref 135–145)

## 2022-06-24 LAB — HIV ANTIBODY (ROUTINE TESTING W REFLEX): HIV Screen 4th Generation wRfx: NONREACTIVE

## 2022-06-24 LAB — GLUCOSE, CAPILLARY
Glucose-Capillary: 137 mg/dL — ABNORMAL HIGH (ref 70–99)
Glucose-Capillary: 160 mg/dL — ABNORMAL HIGH (ref 70–99)

## 2022-06-24 MED ORDER — MORPHINE SULFATE (PF) 2 MG/ML IV SOLN
2.0000 mg | Freq: Once | INTRAVENOUS | Status: AC
  Administered 2022-06-24: 2 mg via INTRAVENOUS
  Filled 2022-06-24: qty 1

## 2022-06-24 MED ORDER — OXYCODONE-ACETAMINOPHEN 5-325 MG PO TABS: 1.0000 | ORAL_TABLET | Freq: Four times a day (QID) | ORAL | 0 refills | Status: AC | PRN

## 2022-06-24 MED ORDER — CALCIUM CARBONATE ANTACID 500 MG PO CHEW
400.0000 mg | CHEWABLE_TABLET | Freq: Two times a day (BID) | ORAL | Status: DC
  Administered 2022-06-24: 400 mg via ORAL
  Filled 2022-06-24: qty 2

## 2022-06-24 MED ORDER — KETOROLAC TROMETHAMINE 15 MG/ML IJ SOLN: 15.0000 mg | Freq: Once | INTRAMUSCULAR | Status: DC

## 2022-06-24 MED ORDER — DOCUSATE SODIUM 100 MG PO CAPS: 200.0000 mg | ORAL_CAPSULE | Freq: Two times a day (BID) | ORAL | Status: DC | PRN

## 2022-06-24 MED ORDER — POTASSIUM CHLORIDE CRYS ER 20 MEQ PO TBCR
40.0000 meq | EXTENDED_RELEASE_TABLET | Freq: Once | ORAL | Status: AC
  Administered 2022-06-24: 40 meq via ORAL
  Filled 2022-06-24: qty 2

## 2022-06-24 NOTE — Consult Note (Signed)
SURGICAL CONSULTATION NOTE   HISTORY OF PRESENT ILLNESS (HPI):  57 y.o. male presented to [facility] ED for evaluation of abdominal pain. Patient reports having central abdominal pain.  Patient cannot pinpoint an area of abdominal pain.  No pain radiation.  Patient cannot identify any elevating or aggravating factors.  As per patient and wife he has had chronic history of diverticulitis.  I evaluated the chart and patient has been seen by Dr. Everlene Farrier, Dr. Aleen Campi, Dr. Tonna Boehringer and Dr. Michaell Cowing for management of recommendation of diverticulitis.  All of them recommended partial colectomy.  As per patient he continues on chronic oral antibiotic therapy for his diverticulitis.  He also endorses that he used ibuprofen 800 mg and Excedrin every 6 hours daily for chronic pain.  At the ED he was found with no significant tenderness to palpation but complaining of abdominal pain.  No leukocytosis.  He had a CT scan of the abdomen and pelvis with mild elevation of the small intestine.  Radiology reported that this could be nonspecific but cannot rule out early bowel obstruction.  Patient otherwise is passing gas.  Surgery is consulted by Dr. Scotty Court in this context for evaluation and management of bowel obstruction.  PAST MEDICAL HISTORY (PMH):  Past Medical History:  Diagnosis Date   Diverticulitis    Hay fever      PAST SURGICAL HISTORY (PSH):  History reviewed. No pertinent surgical history.   MEDICATIONS:  Prior to Admission medications   Medication Sig Start Date End Date Taking? Authorizing Provider  albuterol (VENTOLIN HFA) 108 (90 Base) MCG/ACT inhaler Inhale 2 puffs into the lungs every 4 (four) hours as needed for wheezing or shortness of breath. Patient not taking: Reported on 06/23/2022 04/02/20   Massie Maroon, FNP  amoxicillin-clavulanate (AUGMENTIN) 875-125 MG tablet Take 1 tablet by mouth 2 (two) times daily. 03/28/20   Midge Minium, MD  Ascorbic Acid (VITAMIN C) 1000 MG tablet Take 1,000  mg by mouth daily.    [provider]  aspirin EC 81 MG tablet Take 1 tablet (81 mg total) by mouth daily. Swallow whole. 01/12/20   Theadore Nan, NP  benzonatate (TESSALON) 100 MG capsule Take 1 capsule (100 mg total) by mouth 3 (three) times daily as needed for cough. Patient not taking: Reported on 06/23/2022 03/26/20   Freddy Finner, NP  blood glucose meter kit and supplies Dispense based on patient and insurance preference. Use up to four times daily as directed. (FOR ICD-10 E10.9, E11.9). 04/23/18   Guse, Janna Arch, FNP  Boswellia-Glucosamine-Vit D (OSTEO BI-FLEX ONE PER DAY PO) Take by mouth.    [provider]  dextromethorphan 15 MG/5ML syrup Take 10 mLs (30 mg total) by mouth 4 (four) times daily as needed for cough. Patient not taking: Reported on 06/23/2022 04/02/20   Massie Maroon, FNP  fexofenadine (ALLEGRA) 180 MG tablet Take 180 mg by mouth daily.    [provider]  Garlic 1000 MG CAPS Take 1,000 mg by mouth. Patient not taking: Reported on 06/23/2022    [provider]  glucose blood test strip Used to check blood sugars four times daily. 01/11/19   Guse, Janna Arch, FNP  ibuprofen (ADVIL,MOTRIN) 800 MG tablet Take 800 mg by mouth every 8 (eight) hours as needed.    [provider]  Insulin Pen Needle (PEN NEEDLES 29GX1/2") 29G X MISC Use daily with victoza 05/04/18   Guse, Janna Arch, FNP  metFORMIN (GLUCOPHAGE) 1000 MG tablet Take  1 tablet (1,000 mg total) by mouth 2 (two) times daily with a meal. 07/25/19   Theadore Nan, NP  metroNIDAZOLE (FLAGYL) 500 MG tablet Take 1 tablet (500 mg total) by mouth 3 (three) times daily. 03/28/20   Midge Minium, MD  Multiple Vitamin (MULTIVITAMIN) tablet Take 1 tablet by mouth daily.    [provider]  Omega-3 Fatty Acids (FISH OIL) 1000 MG CAPS Take 1,000 mg by mouth 2 (two) times daily.    [provider]  omeprazole (PRILOSEC) 20 MG capsule Take 20 mg by mouth daily.      [provider]  potassium chloride (KLOR-CON) 10 MEQ tablet Take 1 tablet (10 mEq total) by mouth daily. PLEASE SCHEDULE OFFICE VISIT FOR FURTHER REFILLS. THANK YOU! 12/17/20 03/17/21  Debbe Odea, MD  predniSONE (DELTASONE) 20 MG tablet Take 1 tablet (20 mg total) by mouth daily with breakfast. Patient not taking: Reported on 06/23/2022 04/02/20   Massie Maroon, FNP  Probiotic Product (PROBIOTIC ADVANCED PO) Take by mouth.    [provider]  rosuvastatin (CRESTOR) 10 MG tablet Take 1 tablet (10 mg total) by mouth daily. Patient not taking: Reported on 06/23/2022 01/12/20   Theadore Nan, NP  sitaGLIPtin (JANUVIA) 100 MG tablet TAKE 1 TABLET(100 MG) BY MOUTH DAILY 03/12/20   Allegra Grana, FNP  torsemide (DEMADEX) 20 MG tablet Take 1 tablet (20 mg total) by mouth daily. PLEASE SCHEDULE OFFICE VISIT FOR FURTHER REFILLS. THANK YOU! 12/17/20 03/17/21  Debbe Odea, MD     ALLERGIES:  No Known Allergies   SOCIAL HISTORY:  Social History   Socioeconomic History   Marital status: Married    Spouse name: Not on file   Number of children: Not on file   Years of education: Not on file   Highest education level: Not on file  Occupational History   Not on file  Tobacco Use   Smoking status: Never   Smokeless tobacco: Current    Types: Chew  Vaping Use   Vaping Use: Never used  Substance and Sexual Activity   Alcohol use: Yes    Alcohol/week: 28.0 standard drinks of alcohol    Types: 28 Cans of beer per week   Drug use: Never   Sexual activity: Yes  Other Topics Concern   Not on file  Social History Narrative   Not on file   Social Determinants of Health   Financial Resource Strain: Not on file  Food Insecurity: No Food Insecurity (06/23/2022)   Hunger Vital Sign    Worried About Running Out of Food in the Last Year: Never true    Ran Out of Food in the Last Year: Never true  Transportation Needs: No Transportation Needs (06/23/2022)    PRAPARE - Administrator, Civil Service (Medical): No    Lack of Transportation (Non-Medical): No  Physical Activity: Not on file  Stress: Not on file  Social Connections: Not on file  Intimate Partner Violence: Not At Risk (06/23/2022)   Humiliation, Afraid, Rape, and Kick questionnaire    Fear of Current or Ex-Partner: No    Emotionally Abused: No    Physically Abused: No    Sexually Abused: No      FAMILY HISTORY:  Family History  Problem Relation Age of Onset   Hypertension Father    Hypertension Paternal Uncle      REVIEW OF SYSTEMS:  Constitutional: denies weight loss, fever, chills, or sweats  Eyes: denies any other  vision changes, history of eye injury  ENT: denies sore throat, hearing problems  Respiratory: denies shortness of breath, wheezing  Cardiovascular: denies chest pain, palpitations  Gastrointestinal: Positive abdominal pain, nausea and vomiting Genitourinary: denies burning with urination or urinary frequency Musculoskeletal: denies any other joint pains or cramps  Skin: denies any other rashes or skin discolorations  Neurological: denies any other headache, dizziness, weakness  Psychiatric: denies any other depression, anxiety   All other review of systems were negative   VITAL SIGNS:  Temp:  [97.5 F (36.4 C)-98.2 F (36.8 C)] 97.5 F (36.4 C) (04/23 0553) Pulse Rate:  [80-112] 82 (04/23 0553) Resp:  [18-20] 18 (04/23 0553) BP: (107-144)/(49-78) 107/70 (04/23 0553) SpO2:  [98 %-99 %] 98 % (04/23 0553) Weight:  [154.2 kg] 154.2 kg (04/22 1644)     Height:  (172.7 cm) Weight: (!) 154.2 kg BMI (Calculated): 51.71   INTAKE/OUTPUT:  This shift: No intake/output data recorded.  Last 2 shifts: @   PHYSICAL EXAM:  Constitutional:  -- Morbidly obese -- Awake, alert, and oriented x3  Eyes:  -- Pupils equally round and reactive to light  -- No scleral icterus  Ear, nose, and throat:  -- No jugular venous distension   Pulmonary:  -- No crackles  -- Equal breath sounds bilaterally -- Breathing non-labored at rest Cardiovascular:  -- S1, S2 present  -- No pericardial rubs Gastrointestinal:  -- Abdomen soft, nontender, non-distended, no guarding or rebound tenderness -- No abdominal masses appreciated, pulsatile or otherwise  Musculoskeletal and Integumentary:  -- Wounds: None appreciated -- Extremities: B/L UE and LE FROM, hands and feet warm, no edema  Neurologic:  -- Motor function: intact and symmetric -- Sensation: intact and symmetric   Labs:     Latest Ref Rng & Units 06/24/2022    5:44 AM 06/23/2022    5:51 PM 02/27/2020    4:34 PM  CBC  WBC 4.0 - 10.5 K/uL 9.2  9.3  9.3   Hemoglobin 13.0 - 17.0 g/dL 16.1  09.6  04.5   Hematocrit 39.0 - 52.0 % 34.1  39.7  40.9   Platelets 150 - 400 K/uL 265  334  271       Latest Ref Rng & Units 06/24/2022    5:44 AM 06/23/2022    5:51 PM 02/27/2020    4:34 PM  CMP  Glucose 70 - 99 mg/dL 409  811  914   BUN 6 - 20 mg/dL Creatinine 0.61 - 1.24 mg/dL 7.82  9.56  2.13   Sodium 135 - 145 mmol/L 138  138  141   Potassium 3.5 - 5.1 mmol/L 3.1  3.5  3.7   Chloride 98 - 111 mmol/L 107  107  108   CO2 22 - 32 mmol/L Calcium 8.9 - 10.3 mg/dL 6.7  7.1  8.9   Total Protein 6.5 - 8.1 g/dL  6.8  7.6   Total Bilirubin 0.3 - 1.2 mg/dL  0.3  0.5   Alkaline Phos 38 - 126 U/L  113  74   AST 15 - 41 U/L  39  23   ALT 0 - 44 U/L  34  22     Imaging studies:  I personally evaluated the CT scan of the abdomen and pelvis.  There is minimal small bowel dilation.  There is no transition point.  No free air or free fluid.  Assessment/Plan:  57 y.o. male with abdominal pain, nausea and vomiting, complicated by pertinent comorbidities including morbidly obese.  I discussed with patient the concern of possible early bowel obstruction as reported on the CT scan.  Clinically he is not presenting in the usual small bowel obstruction.  Patient  without previous abdominal surgery.  This morning patient without abdominal pain, no nausea no vomiting.  He endorses that he is passing gas and having bowel movement.  I discussed with patient that I will recommend to start clear liquid diet and he can be advanced as tolerated.  As per hospitalist the patient by crackers for himself and he ate the crackers without any medical recommendation.  I think that at this point there is no contraindication for discharge if patient tolerated his crackers.   Gae Gallop, MD

## 2022-06-24 NOTE — Plan of Care (Signed)

## 2022-06-24 NOTE — Discharge Summary (Addendum)
Physician Discharge Summary  Dustin Mcintyre XLK:[id number] DOB: 07/15/1965 DOA: 06/23/2022  PCP: Theadore Nan, NP  Admit date: 06/23/2022 Discharge date: 06/24/2022  Admitted From: home  Disposition:  home   Recommendations for Outpatient Follow-up:  Follow up with PCP in 1-2 weeks F/u w/ GI to f/u on melena in 1-2 weeks  Home Health: no  Equipment/Devices:  Discharge Condition: stable  CODE STATUS: full  Diet recommendation: Heart Healthy / Carb Modified   Brief/Interim Summary: HPI was taken from Dr. Huel Cote:  Dustin Mcintyre is a 57 y.o. male with medical history significant of recurrent diverticulitis, Type 2 diabetes mellitus, hyperlipidemia who presents to the ED due to abdominal pain.   [name] states that on the evening of 4/19, he developed rapid onset epigastric abdominal pain with distention.  He states that the abdominal pain is waxing and waning with severity.  In addition, he was experiencing nausea with several episodes of vomiting.  Since that time, he has been able to hold down liquids and minimal solids.  He has not had any additional vomiting since the early morning hours of 4/20.  He has continued to have bowel movements but states they have been much smaller in quantity and have been green with a foul odor.  Due to concern that this may be a diverticulitis flare, he began taking Augmentin and metronidazole on Friday evening but stopped after his stool became green as he was concerned for possible C. difficile.  He endorses hot and cold sweats but denies any known fever or chills.  He denies any chest pain or palpitations.  Since his abdomen has been distended, he endorses some increased work of breathing.   Of note, patient states that he had black stool approximately 5 days ago but this has not recurred since.  He notes that he takes at least 4 to 8 tablets of ibuprofen daily in addition to Excedrin every 6 hours.  He has taken more since his abdominal symptoms  started.   ED course: On arrival to the ED, patient was normotensive at 144/78 with heart rate of 88.  He was saturating at 98% on room air.  He was afebrile at 97.7.  Initial workup notable for WBC of 9.3, hemoglobin 12.4, platelets 334, potassium 3.5, glucose 152, creatinine 0.62, and GFR above 60. CT of the abdomen was obtained that demonstrated mildly dilated proximal small bowel with gradual transition to more decompressed distal small bowel that is non-specific but concerning for early or partial SBO.  [facility] contacted for admission.    Discharge Diagnoses:  Principal Problem:   Epigastric abdominal pain Active Problems:   Melena   Diabetes mellitus  Epigastric abdominal pain: w/ possible early or partial SBO as per CT.  No abd pain, nausea, or vomiting today. Tolerating po diet prior to d/c. Zofran prn for nausea/vomiting. Gen surg recs apprec    Melena: reported melena that occurred 5 days ago without recurrence since.  Per EDP, FOBT is positive. Takes high doses of NSAIDs daily placing him at risk for peptic ulcer disease, gastritis. No NSAIDs. Discussed NSAIDs usage/dosage & ADRs including but not limited to ulcers, GI bleeding & kidney disease. Pt verbalized his understanding. Continue on PPI. Will need to f/u outpatient w/ GI for melena  Hypokalemia: potassium given  Hypocalcemia: calcium given. Pt will take po OTC calcium x 1 week.    DM2: likely poorly controlled. Continue on glargine, SSI w/ accuchecks.   Morbid obesity: BMI 51.7. Complicates overall care &  prognosis   Discharge Instructions  Discharge Instructions     Diet Carb Modified   Complete by: As directed    Discharge instructions   Complete by: As directed    F/u w/ PCP in 1-2 weeks. F/u w/ GI as an outpatient to follow-up on melena/dark tarry stools   Increase activity slowly   Complete by: As directed       Allergies as of 06/24/2022   No Known Allergies      Medication List     STOP taking these  medications    amoxicillin-clavulanate 875-125 MG tablet Commonly known as: AUGMENTIN   benzonatate 100 MG capsule Commonly known as: TESSALON   dextromethorphan 15 MG/5ML syrup   Garlic 1000 MG Caps   metroNIDAZOLE 500 MG tablet Commonly known as: FLAGYL   predniSONE 20 MG tablet Commonly known as: DELTASONE       TAKE these medications    albuterol 108 (90 Base) MCG/ACT inhaler Commonly known as: VENTOLIN HFA Inhale 2 puffs into the lungs every 4 (four) hours as needed for wheezing or shortness of breath.   aspirin EC 81 MG tablet Take 1 tablet (81 mg total) by mouth daily. Swallow whole.   blood glucose meter kit and supplies Dispense based on patient and insurance preference. Use up to four times daily as directed. (FOR ICD-10 E10.9, E11.9).   fexofenadine 180 MG tablet Commonly known as: ALLEGRA Take 180 mg by mouth daily.   Fish Oil 1000 MG Caps Take 1,000 mg by mouth 2 (two) times daily.   glucose blood test strip Used to check blood sugars four times daily.   ibuprofen 800 MG tablet Commonly known as: ADVIL Take 800 mg by mouth every 8 (eight) hours as needed.   metFORMIN 1000 MG tablet Commonly known as: GLUCOPHAGE Take 1 tablet (1,000 mg total) by mouth 2 (two) times daily with a meal.   multivitamin tablet Take 1 tablet by mouth daily.   omeprazole 20 MG capsule Commonly known as: PRILOSEC Take 20 mg by mouth daily.   OSTEO BI-FLEX ONE PER DAY PO Take by mouth.   oxyCODONE-acetaminophen 5-325 MG tablet Commonly known as: Percocet Take 1 tablet by mouth every 6 (six) hours as needed for up to 3 days for severe pain or moderate pain.   PEN NEEDLES 29GX1/2" 29G X Misc Use daily with victoza   potassium chloride 10 MEQ tablet Commonly known as: KLOR-CON Take 1 tablet (10 mEq total) by mouth daily. PLEASE SCHEDULE OFFICE VISIT FOR FURTHER REFILLS. THANK YOU!   PROBIOTIC ADVANCED PO Take by mouth.   rosuvastatin 10 MG  tablet Commonly known as: Crestor Take 1 tablet (10 mg total) by mouth daily.   sitaGLIPtin 100 MG tablet Commonly known as: Januvia TAKE 1 TABLET(100 MG) BY MOUTH DAILY   torsemide 20 MG tablet Commonly known as: DEMADEX Take 1 tablet (20 mg total) by mouth daily. PLEASE SCHEDULE OFFICE VISIT FOR FURTHER REFILLS. THANK YOU!   vitamin C 1000 MG tablet Take 1,000 mg by mouth daily.        No Known Allergies  Consultations: Gen surg    Procedures/Studies: CT ABDOMEN PELVIS W CONTRAST  Result Date: 06/23/2022 CLINICAL DATA:  Abdominal pain EXAM: CT ABDOMEN AND PELVIS WITH CONTRAST TECHNIQUE: Multidetector CT imaging of the abdomen and pelvis was performed using the standard protocol following bolus administration of intravenous contrast. RADIATION DOSE REDUCTION: This exam was performed according to the departmental dose-optimization program which includes automated exposure  control, adjustment of the mA and/or kV according to patient size and/or use of iterative reconstruction technique. CONTRAST:  OMNIPAQUE IOHEXOL 350 MG/ML SOLN COMPARISON:  CT abdomen and pelvis dated November 25, 2019 FINDINGS: Lower chest: No acute abnormality. Hepatobiliary: Indeterminate irregular low-attenuation lesion of the right lobe of the liver measuring 3.2 x 2.6 cm. Gallbladder is unremarkable. No biliary ductal dilation. Pancreas: Unremarkable. No pancreatic ductal dilatation or surrounding inflammatory changes. Spleen: Normal in size without focal abnormality. Adrenals/Urinary Tract: Adrenal glands are unremarkable. Kidneys are normal, without renal calculi, focal lesion, or hydronephrosis. Bladder is unremarkable. Stomach/Bowel: Mildly dilated proximal small bowel loops which gradually transition to more decompressed distal small bowel loops. Stomach is within normal limits. Appendix appears normal. Diverticulosis. No evidence of wall thickening or inflammatory change. Vascular/Lymphatic: Aortic  atherosclerosis. No enlarged abdominal or pelvic lymph nodes. Reproductive: Prostate is unremarkable. Other: No abdominal wall hernia or abnormality. No abdominopelvic ascites. Musculoskeletal: No acute or significant osseous findings. IMPRESSION: 1. Mildly dilated proximal small bowel loops which gradually transition to more decompressed distal small bowel loops, findings are nonspecific but could be due to early or partial small bowel obstruction. 2. Diverticulosis with no evidence of diverticulitis. 3. Indeterminate irregular low-attenuation lesion of the right lobe of the liver. Recommend nonemergent liver protocol MRI for further evaluation. 4. Aortic Atherosclerosis (ICD10-I70.0). Electronically Signed   By: Allegra Lai M.D.   On: 06/23/2022 19:39   (Echo, Carotid, EGD, Colonoscopy, ERCP)    Subjective: Pt c/o fatigue    Discharge Exam: Vitals:   06/24/22 0553 06/24/22 0829  BP: 107/70 116/77  Pulse: 82 81  Resp: 18 20  Temp: (!) 97.5 F (36.4 C) 97.7 F (36.5 C)  SpO2: 98% 96%   Vitals:   06/23/22 2205 06/23/22 2306 06/24/22 0553 06/24/22 0829  BP: 117/73 (!) 111/49 107/70 116/77  Pulse: (!) 112 80 82 81  Resp: 20 20 18 20   Temp: 98.2 F (36.8 C) 97.9 F (36.6 C) (!) 97.5 F (36.4 C) 97.7 F (36.5 C)  TempSrc: Oral     SpO2: 98% 99% 98% 96%  Weight:      Height:        General: Pt is alert, awake, not in acute distress. Morbidly obese Cardiovascular: S1/S2 +, no rubs, no gallops Respiratory: CTA bilaterally, no wheezing, no rhonchi Abdominal: Soft, NT, obese, bowel sounds + Extremities: no cyanosis    The results of significant diagnostics from this hospitalization (including imaging, microbiology, ancillary and laboratory) are listed below for reference.     Microbiology: Recent Results (from the past 240 hour(s))  C Difficile Quick Screen w PCR reflex     Status: None   Collection Time: 06/23/22  6:34 PM   Specimen: STOOL  Result Value Ref Range Status    C Diff antigen NEGATIVE NEGATIVE Final   C Diff toxin NEGATIVE NEGATIVE Final   C Diff interpretation No C. difficile detected.  Final    Comment: Performed at [facility], [address] [place], [place] [address]     Labs: BNP (last 3 results) No results for input(s): "BNP" in the last 8760 hours. Basic Metabolic Panel: Recent Labs  Lab 06/23/22 1751 06/24/22 0544  NA 138 138  K 3.5 3.1*  CL 107 107  CO2 23 24  GLUCOSE 152* 130*  BUN 13 10  CREATININE 0.62 0.54*  CALCIUM 7.1* 6.7*   Liver Function Tests: Recent Labs  Lab 06/23/22 1751  AST 39  ALT 34  ALKPHOS 113  BILITOT 0.3  PROT 6.8  ALBUMIN 3.7   Recent Labs  Lab 06/23/22 1751  LIPASE 32   No results for input(s): "AMMONIA" in the last 168 hours. CBC: Recent Labs  Lab 06/23/22 1751 06/24/22 0544  WBC 9.3 9.2  NEUTROABS 5.9  --   HGB 12.4* 10.7*  HCT 39.7 34.1*  MCV 87.4 87.9  PLT 334 265   Cardiac Enzymes: No results for input(s): "CKTOTAL", "CKMB", "CKMBINDEX", "TROPONINI" in the last 168 hours. BNP: Invalid input(s): "POCBNP" CBG: Recent Labs  Lab 06/23/22 2307 06/24/22 0822 06/24/22 1116  GLUCAP 142* 137* 160*   D-Dimer No results for input(s): "DDIMER" in the last 72 hours. Hgb A1c Recent Labs    06/23/22 1751  HGBA1C 7.8*   Lipid Profile No results for input(s): "CHOL", "HDL", "LDLCALC", "TRIG", "CHOLHDL", "LDLDIRECT" in the last 72 hours. Thyroid function studies No results for input(s): "TSH", "T4TOTAL", "T3FREE", "THYROIDAB" in the last 72 hours.  Invalid input(s): "FREET3" Anemia work up No results for input(s): "VITAMINB12", "FOLATE", "FERRITIN", "TIBC", "IRON", "RETICCTPCT" in the last 72 hours. Urinalysis    Component Value Date/Time   COLORURINE YELLOW (A) 07/12/2019 0021   APPEARANCEUR CLEAR (A) 07/12/2019 0021   LABSPEC 1.026 07/12/2019 0021   PHURINE 6.0 07/12/2019 0021   GLUCOSEU NEGATIVE 07/12/2019 0021   HGBUR NEGATIVE 07/12/2019 0021    BILIRUBINUR NEGATIVE 07/12/2019 0021   KETONESUR NEGATIVE 07/12/2019 0021   PROTEINUR NEGATIVE 07/12/2019 0021   NITRITE NEGATIVE 07/12/2019 0021   LEUKOCYTESUR NEGATIVE 07/12/2019 0021   Sepsis Labs Recent Labs  Lab 06/23/22 1751 06/24/22 0544  WBC 9.3 9.2   Microbiology Recent Results (from the past 240 hour(s))  C Difficile Quick Screen w PCR reflex     Status: None   Collection Time: 06/23/22  6:34 PM   Specimen: STOOL  Result Value Ref Range Status   C Diff antigen NEGATIVE NEGATIVE Final   C Diff toxin NEGATIVE NEGATIVE Final   C Diff interpretation No C. difficile detected.  Final    Comment: Performed at [facility], [address] [place], [place] [address]     Time coordinating discharge: Over 30 minutes  SIGNED:   Charise Killian, MD  [facility] 06/24/2022, 11:55 AM Pager   If 7PM-7AM, please contact night-coverage [email or web address]

## 2022-06-24 NOTE — Progress Notes (Signed)
Discharge instructions were reviewed with patient and family. Questions were encourage and answered. IV was taken out. Belongings were collected by family.

## 2022-06-24 NOTE — Progress Notes (Signed)
  Transition of Care [facility]) Screening Note   Patient Details  Name: Dustin Mcintyre Date of Birth: 1966-02-06   Transition of Care [facility]) CM/SW Contact:    Chapman Fitch, RN Phone Number: 06/24/2022, 9:35 AM    Transition of Care Department [facility]) has reviewed patient and no TOC needs have been identified at this time. We will continue to monitor patient advancement through interdisciplinary progression rounds. If new patient transition needs arise, please place a TOC consult.

## 2022-07-20 IMAGING — CT CT ABD-PELV W/O CM
1 of 2 series · 14 of 32 positions shown, 18 images · non-contrast
Comparison: None.

CLINICAL DATA: Left lower quadrant abdominal pain.  Bloating.

EXAM:
CT ABDOMEN AND PELVIS WITHOUT CONTRAST
TECHNIQUE: Multidetector CT imaging of the abdomen and pelvis was performed
following the standard protocol without IV contrast.

[Series 2: axial st · axial · 0.98mm/px · z∈[-1054,-614]mm · 14 of 98 slices shown, 18 images]
[im 5/98  soft-tissue]
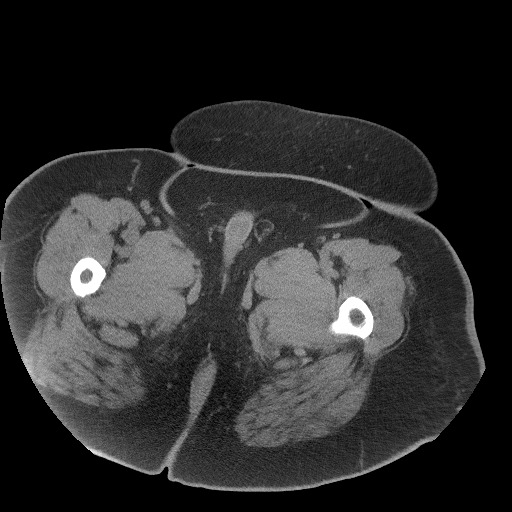
[im 5/98  bone]
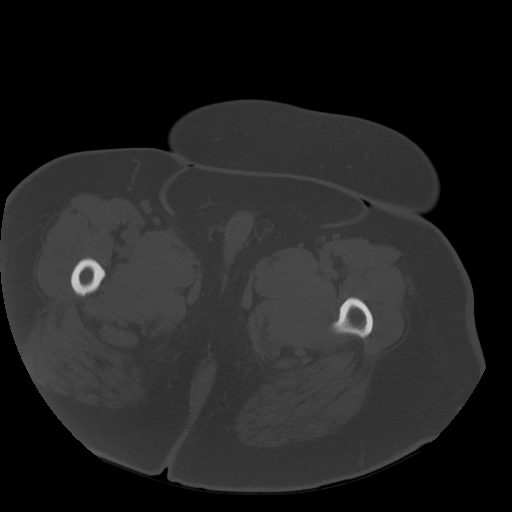
[im 13/98  soft-tissue]
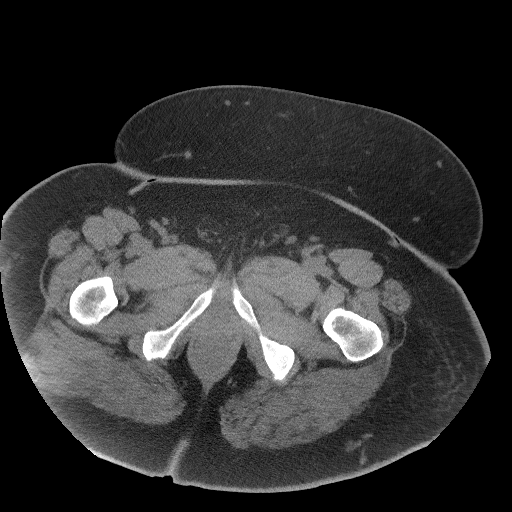
[im 21/98  soft-tissue]
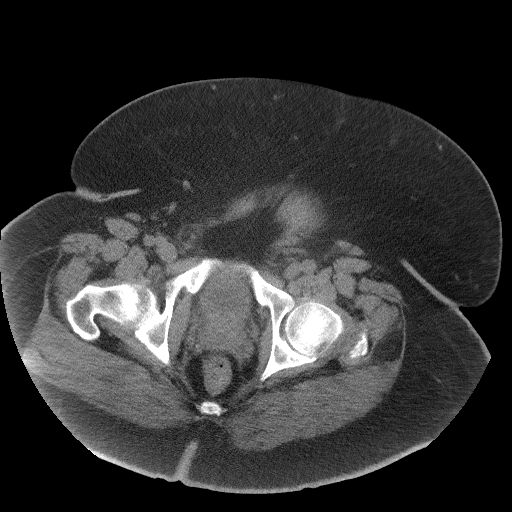
[im 29/98  soft-tissue]
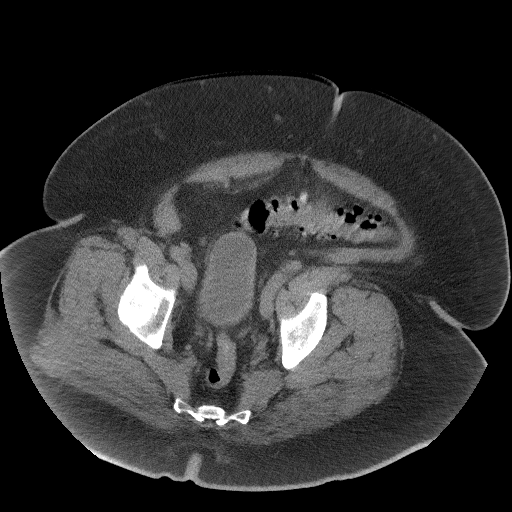
[im 37/98  soft-tissue]
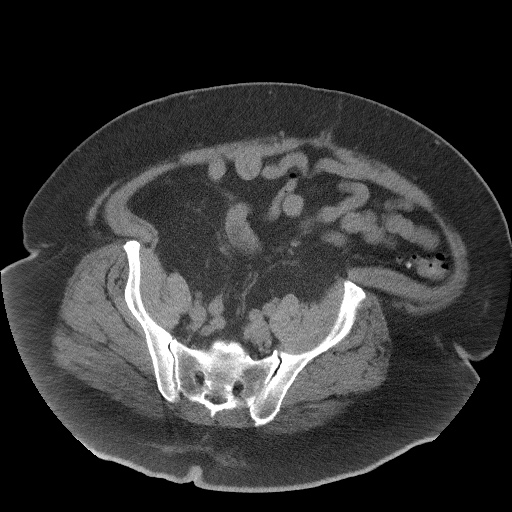
[im 45/98  soft-tissue]
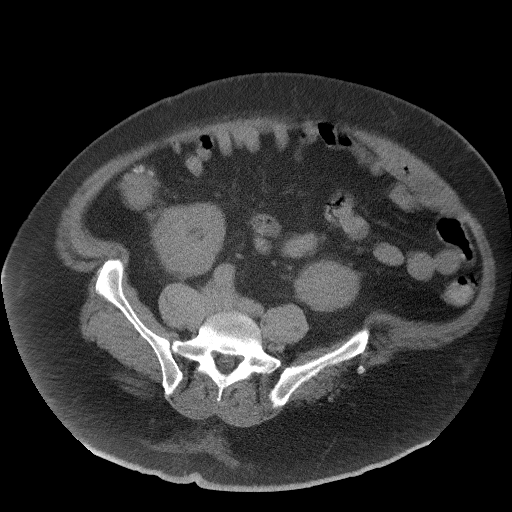
[im 53/98  soft-tissue]
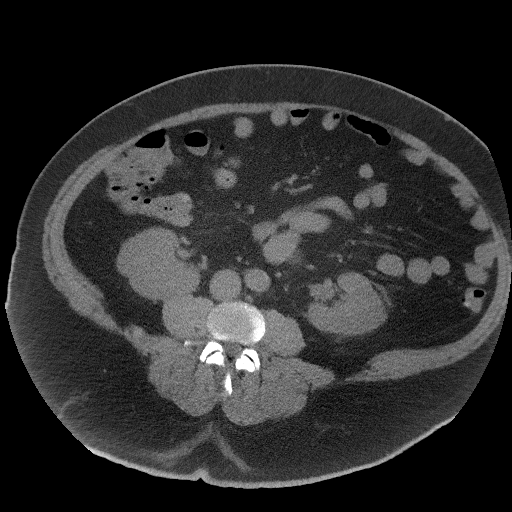
[im 61/98  soft-tissue]
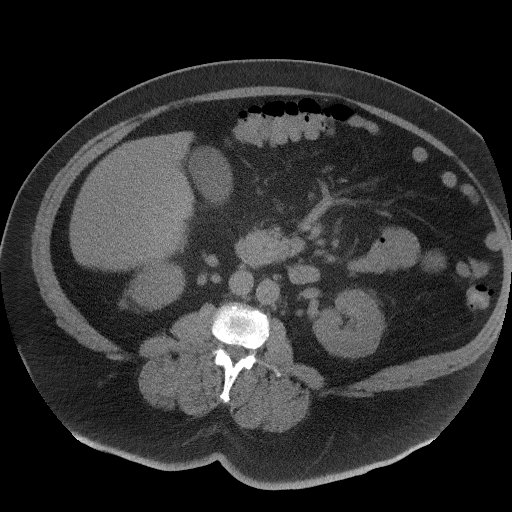
[im 69/98  soft-tissue]
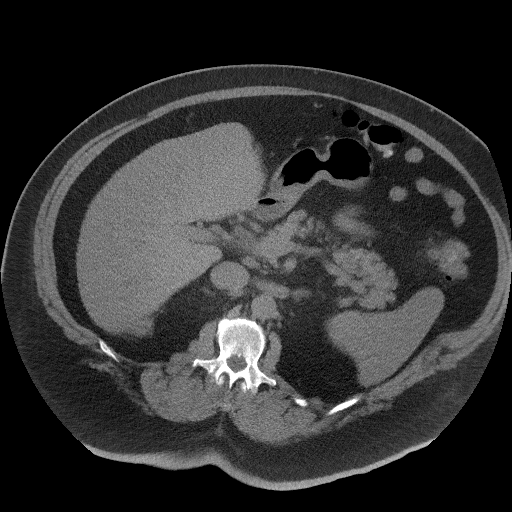
[im 69/98  bone]
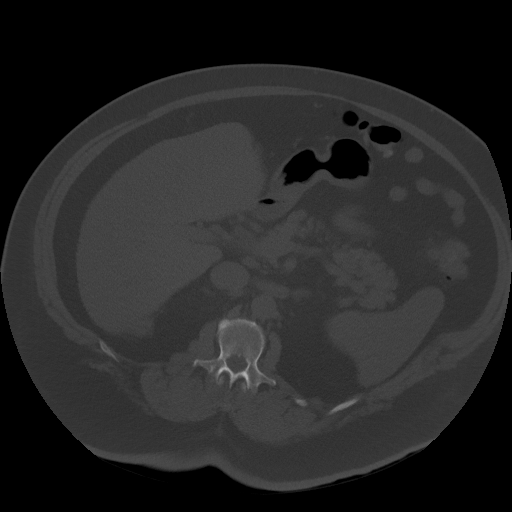
[im 77/98  soft-tissue]
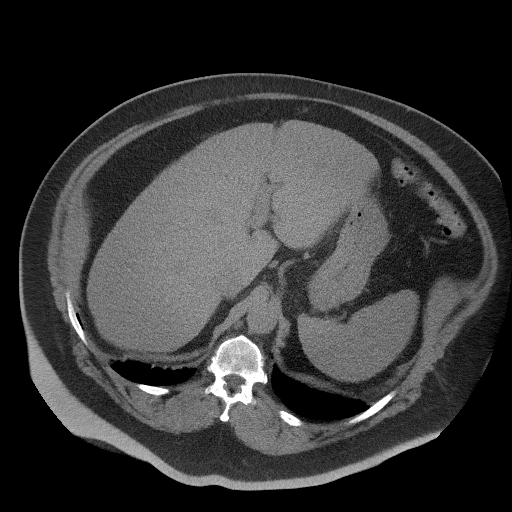
[im 81/98  lung]
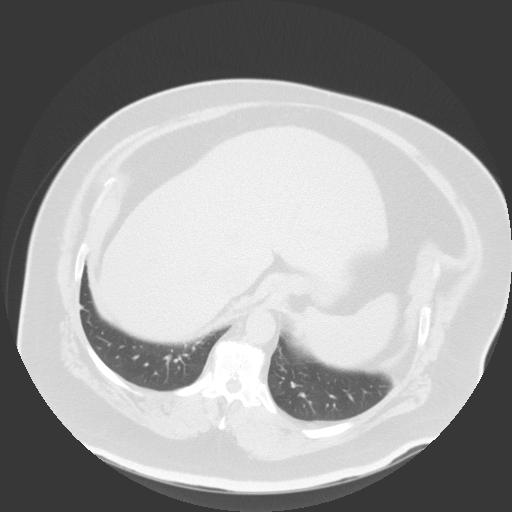
[im 85/98  soft-tissue]
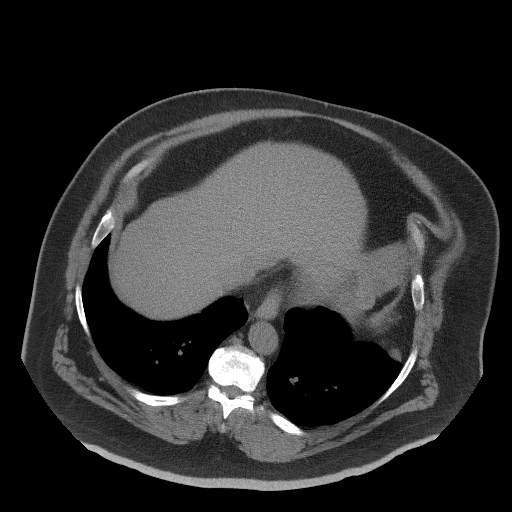
[im 85/98  lung]
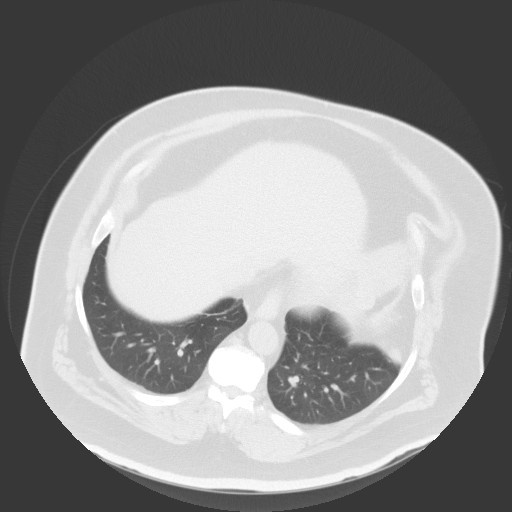
[im 89/98  lung]
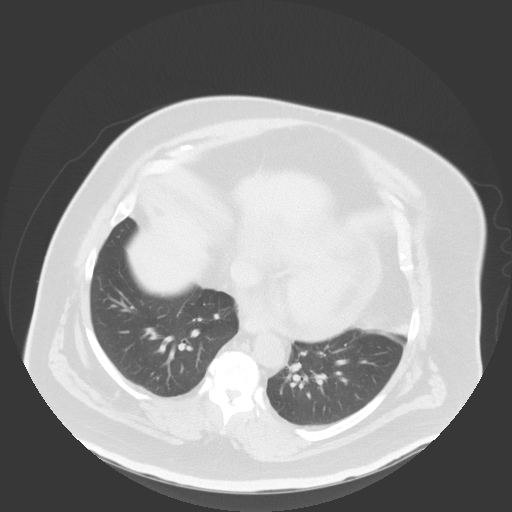
[im 93/98  soft-tissue]
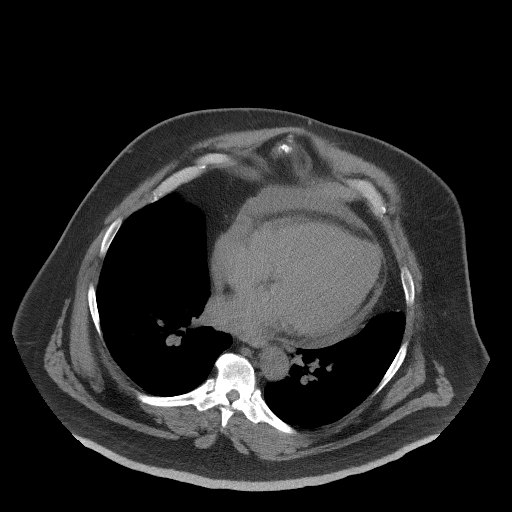
[im 93/98  lung]
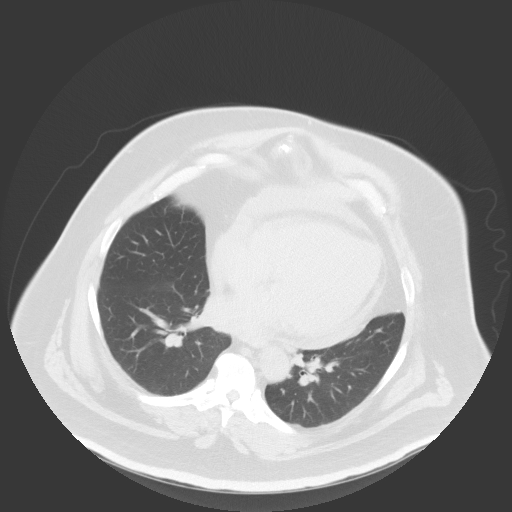

[14 of 32 positions shown; findings below may reference images not displayed]

FINDINGS: Lower chest: Moderate-sized pericardial effusion measures up to
cm in depth. No pleural fluid or acute airspace disease.

Hepatobiliary: Enlarged liver spanning 20 cm cranial caudal. Mild
hepatic steatosis. No evidence of focal lesion on noncontrast exam.
Gallbladder is unremarkable. No pericholecystic inflammation or
calcified gallstone.

Pancreas: No ductal dilatation or inflammation.

Spleen: Upper normal in size spanning 13.3 cm. No focal abnormality.

Adrenals/Urinary Tract: Mild left adrenal thickening without
dominant nodule. Normal right adrenal gland. No hydronephrosis.
There is mild symmetric bilateral perinephric edema. Slight renal
malrotation with renal pelvis directed anteriorly and lower poles
coursing inferior medially. No obvious focal renal mass on
noncontrast exam. Partially distended urinary bladder. No bladder
stone or wall thickening.

Stomach/Bowel: Inflamed diverticulum involving the proximal sigmoid
colon, series 2, image 69. There is adjacent fat stranding and
pericolonic edema. No pericolonic abscess or evidence of free air.
There is an inflamed diverticulum in the left upper quadrant at the
splenic flexure, series 2, image 29. No perforation or abscess.
Innumerable additional colonic diverticula throughout the entire
colon. Normal appendix. There is no small bowel dilatation or
obstruction. Stomach is decompressed.

Vascular/Lymphatic: Aorto bi-iliac atherosclerosis. Circumaortic
left renal vein. Prominent periportal nodes are likely reactive.

Reproductive: Prostate is unremarkable.

Other: No free air or free fluid. No abscess. Fat in both inguinal
canals.

Musculoskeletal: Mild lower lumbar facet hypertrophy. Transitional
lumbosacral anatomy.
IMPRESSION: 1. Two separate sites of acute diverticulitis, 1 at the splenic
flexure and 1 in the sigmoid colon. No perforation or abscess.
2. Innumerable additional colonic diverticula throughout the entire
colon.
3. Hepatomegaly and hepatic steatosis.
4. Moderate-sized pericardial effusion.

Aortic Atherosclerosis ([id number]).

## 2023-02-27 ENCOUNTER — Encounter: Payer: Self-pay | Admitting: Nurse Practitioner

## 2023-03-02 ENCOUNTER — Other Ambulatory Visit: Payer: Self-pay | Admitting: Nurse Practitioner

## 2023-03-02 DIAGNOSIS — R748 Abnormal levels of other serum enzymes: Secondary | ICD-10-CM

## 2023-03-02 DIAGNOSIS — K769 Liver disease, unspecified: Secondary | ICD-10-CM

## 2023-03-17 ENCOUNTER — Ambulatory Visit: Payer: Self-pay

## 2023-04-14 ENCOUNTER — Other Ambulatory Visit: Payer: Self-pay

## 2023-04-14 ENCOUNTER — Emergency Department: Payer: PRIVATE HEALTH INSURANCE

## 2023-04-14 ENCOUNTER — Emergency Department
Admission: EM | Admit: 2023-04-14 | Discharge: 2023-04-14 | Disposition: A | Discharge status: Home / Self Care | Payer: PRIVATE HEALTH INSURANCE | Attending: Emergency Medicine | Admitting: Emergency Medicine

## 2023-04-14 DIAGNOSIS — D72829 Elevated white blood cell count, unspecified: Secondary | ICD-10-CM | POA: Diagnosis not present

## 2023-04-14 DIAGNOSIS — I10 Essential (primary) hypertension: Secondary | ICD-10-CM | POA: Diagnosis not present

## 2023-04-14 DIAGNOSIS — R112 Nausea with vomiting, unspecified: Secondary | ICD-10-CM | POA: Diagnosis not present

## 2023-04-14 DIAGNOSIS — Z20822 Contact with and (suspected) exposure to covid-19: Secondary | ICD-10-CM | POA: Insufficient documentation

## 2023-04-14 DIAGNOSIS — R1032 Left lower quadrant pain: Secondary | ICD-10-CM | POA: Insufficient documentation

## 2023-04-14 DIAGNOSIS — E119 Type 2 diabetes mellitus without complications: Secondary | ICD-10-CM | POA: Diagnosis not present

## 2023-04-14 LAB — CBC WITH DIFFERENTIAL/PLATELET
Abs Immature Granulocytes: 0.06 10*3/uL (ref 0.00–0.07)
Basophils Absolute: 0.1 10*3/uL (ref 0.0–0.1)
Basophils Relative: 1 %
Eosinophils Absolute: 0.4 10*3/uL (ref 0.0–0.5)
Eosinophils Relative: 3 %
HCT: 33.4 % — ABNORMAL LOW (ref 39.0–52.0)
Hemoglobin: 9.9 g/dL — ABNORMAL LOW (ref 13.0–17.0)
Immature Granulocytes: 1 %
Lymphocytes Relative: 21 %
Lymphs Abs: 2.3 10*3/uL (ref 0.7–4.0)
MCH: 23.3 pg — ABNORMAL LOW (ref 26.0–34.0)
MCHC: 29.6 g/dL — ABNORMAL LOW (ref 30.0–36.0)
MCV: 78.6 fL — ABNORMAL LOW (ref 80.0–100.0)
Monocytes Absolute: 1.1 10*3/uL — ABNORMAL HIGH (ref 0.1–1.0)
Monocytes Relative: 10 %
Neutro Abs: 7.4 10*3/uL (ref 1.7–7.7)
Neutrophils Relative %: 64 %
Platelets: 378 10*3/uL (ref 150–400)
RBC: 4.25 MIL/uL (ref 4.22–5.81)
RDW: 16.4 % — ABNORMAL HIGH (ref 11.5–15.5)
WBC: 11.4 10*3/uL — ABNORMAL HIGH (ref 4.0–10.5)
nRBC: 0 % (ref 0.0–0.2)

## 2023-04-14 LAB — RESP PANEL BY RT-PCR (RSV, FLU A&B, COVID)  RVPGX2
Influenza A by PCR: NEGATIVE
Influenza B by PCR: NEGATIVE
Resp Syncytial Virus by PCR: NEGATIVE
SARS Coronavirus 2 by RT PCR: NEGATIVE

## 2023-04-14 LAB — BASIC METABOLIC PANEL
Anion gap: 14 (ref 5–15)
BUN: 17 mg/dL (ref 6–20)
CO2: 21 mmol/L — ABNORMAL LOW (ref 22–32)
Calcium: 8.8 mg/dL — ABNORMAL LOW (ref 8.9–10.3)
Chloride: 105 mmol/L (ref 98–111)
Creatinine, Ser: 0.66 mg/dL (ref 0.61–1.24)
GFR, Estimated: >60 mL/min (ref >60)
Glucose, Bld: 136 mg/dL — ABNORMAL HIGH (ref 70–99)
Potassium: 3.9 mmol/L (ref 3.5–5.1)
Sodium: 140 mmol/L (ref 135–145)

## 2023-04-14 MED ORDER — IOHEXOL 350 MG/ML SOLN
100.0000 mL | Freq: Once | INTRAVENOUS | Status: AC | PRN
  Administered 2023-04-14: 100 mL via INTRAVENOUS

## 2023-04-14 MED ORDER — KETOROLAC TROMETHAMINE 15 MG/ML IJ SOLN
15.0000 mg | Freq: Once | INTRAMUSCULAR | Status: AC
  Administered 2023-04-14: 15 mg via INTRAVENOUS
  Filled 2023-04-14: qty 1

## 2023-04-14 MED ORDER — ONDANSETRON HCL 4 MG/2ML IJ SOLN
4.0000 mg | Freq: Once | INTRAMUSCULAR | Status: AC
  Administered 2023-04-14: 4 mg via INTRAVENOUS
  Filled 2023-04-14: qty 2

## 2023-04-14 MED ORDER — SODIUM CHLORIDE 0.9 % IV BOLUS
1000.0000 mL | Freq: Once | INTRAVENOUS | Status: AC
  Administered 2023-04-14: 1000 mL via INTRAVENOUS

## 2023-04-14 MED ORDER — MAGNESIUM CITRATE PO SOLN
1.0000 | Freq: Once | ORAL | Status: AC
  Administered 2023-04-14: 1 via ORAL
  Filled 2023-04-14: qty 296

## 2023-04-14 NOTE — ED Triage Notes (Signed)
Pt reports difficulty sleeping over the past week, pt recently had his father pass away 2 weeks ago. Pt reports since he developed some n/v and constipation.

## 2023-04-14 NOTE — ED Notes (Signed)
Pt contact made and myself introduced. Provider at bedside at this time. Pt is CAOx4, breathing normally, and normal in color. Pt is in bed resting at this time and pt's wife is at bedside.

## 2023-04-14 NOTE — Discharge Instructions (Signed)
Continue taking your normal bowel regimen at home with the MiraLAX, Dulcolax.  Please stay hydrated.  No other acute findings seen on your CT imaging.  Please follow-up as planned for the MRI of your liver.  Please return for any worsening symptoms.

## 2023-04-14 NOTE — ED Provider Notes (Signed)
[facility] Provider Note    Event Date/Time   First MD Initiated Contact with Patient 04/14/23 [phone]     (approximate)   History   Insomnia and Emesis   HPI Dustin Mcintyre is a 58 y.o. male with history of HTN, HLD, DM2 presenting today for nausea and vomiting.  Patient states for the past couple weeks he has had intermittent nausea, vomiting, and difficulty sleeping.  He has had more recent onset of left lower quadrant pain with a prior history of diverticulitis.  States it feels similar to this.  Feels like his difficulty sleeping is both due to the symptoms as well as recent passing of his father.  Otherwise, denies fever, cough, congestion, chest pain, difficulty breathing, diarrhea.  No blood in his stools.  No prior history of abdominal surgeries.     Physical Exam   Triage Vital Signs: ED Triage Vitals  Encounter Vitals Group     BP 04/14/23 0305 (!) 155/82     Systolic BP Percentile --      Diastolic BP Percentile --      Pulse Rate 04/14/23 0305 89     Resp 04/14/23 0305 18     Temp 04/14/23 0305 98 F (36.7 C)     Temp Source 04/14/23 0305 Oral     SpO2 04/14/23 0305 96 %     Weight 04/14/23 0304 (!) 330 lb (149.7 kg)     Height 04/14/23 0304 5\' 8"  (1.727 m)     Head Circumference --      Peak Flow --      Pain Score 04/14/23 0304 6     Pain Loc --      Pain Education --      Exclude from Growth Chart --     Most recent vital signs: Vitals:   04/14/23 0305 04/14/23 0450  BP: (!) 155/82 (!) 140/84  Pulse: 89 92  Resp: 18 20  Temp: 98 F (36.7 C)   SpO2: 96% 98%   Physical Exam: I have reviewed the vital signs and nursing notes. General: Awake, alert, no acute distress.  Nontoxic appearing. Head:  Atraumatic, normocephalic.   ENT:  EOM intact, PERRL. Oral mucosa is pink and moist with no lesions. Neck: Neck is supple with full range of motion, No meningeal signs. Cardiovascular:  RRR, No murmurs. Peripheral pulses palpable  and equal bilaterally. Respiratory:  Symmetrical chest wall expansion.  No rhonchi, rales, or wheezes.  Good air movement throughout.  No use of accessory muscles.   Musculoskeletal:  No cyanosis or edema. Moving extremities with full ROM Abdomen:  Soft, tenderness palpation left lower quadrant, nondistended. Neuro:  GCS 15, moving all four extremities, interacting appropriately. Speech clear. Psych:  Calm, appropriate.   Skin:  Warm, dry, no rash.    ED Results / Procedures / Treatments   Labs (all labs ordered are listed, but only abnormal results are displayed) Labs Reviewed  CBC WITH DIFFERENTIAL/PLATELET - Abnormal; Notable for the following components:      Result Value   WBC 11.4 (*)    Hemoglobin 9.9 (*)    HCT 33.4 (*)    MCV 78.6 (*)    MCH 23.3 (*)    MCHC 29.6 (*)    RDW 16.4 (*)    Monocytes Absolute 1.1 (*)    All other components within normal limits  BASIC METABOLIC PANEL - Abnormal; Notable for the following components:   CO2 21 (*)  Glucose, Bld 136 (*)    Calcium 8.8 (*)    All other components within normal limits  RESP PANEL BY RT-PCR (RSV, FLU A&B, COVID)  [id number]     EKG    RADIOLOGY Independently interpreted CT imaging with no acute findings   PROCEDURES:  Critical Care performed: No  Procedures   MEDICATIONS ORDERED IN ED: Medications  magnesium citrate solution 1 Bottle (has no administration in time range)  sodium chloride 0.9 % bolus 1,000 mL (1,000 mLs Intravenous Bolus 04/14/23 0520)  ondansetron (ZOFRAN) injection 4 mg (4 mg Intravenous Given 04/14/23 0517)  ketorolac (TORADOL) 15 MG/ML injection 15 mg (15 mg Intravenous Given 04/14/23 0517)  iohexol (OMNIPAQUE) 350 MG/ML injection 100 mL (100 mLs Intravenous Contrast Given 04/14/23 0532)     IMPRESSION / MDM / ASSESSMENT AND PLAN / ED COURSE  I reviewed the triage vital signs and the nursing notes.                              Differential diagnosis includes, but is not  limited to, diverticulitis, enteritis, colitis, viral gastroenteritis, dehydration, electrolyte abnormality  [name] presentation is most consistent with acute complicated illness / injury requiring diagnostic workup.  Patient is a 58 year old male presenting today for left lower quadrant pain associated with nausea, vomiting.  Vital signs are stable and exam most notable for tenderness in the left lower quadrant.  History of diverticulitis and concern today for the same.  Slight leukocytosis on CBC.  BMP with mild hypocarbia but otherwise unremarkable.  COVID, flu, RSV all negative.  Patient was given 1 L fluids, Toradol, and Zofran.  CT abdomen/pelvis was ordered for further evaluation.  CT imaging showed no acute findings such as diverticulitis or other intra-abdominal infections.  There was mention of a spot on the liver which patient is already aware of and already has planned outpatient MRI.  He was reassessed and feeling significantly better at this time.  No ongoing nausea or vomiting.  No abdominal pain.  He did request 1 dose of mag citrate to help with constipation which was ordered.  Patient otherwise safe for discharge and follow-up as planned with his primary care provider.  Clinical Course as of 04/14/23 0641  Tue Apr 14, 2023  0636 CT with no acute intra-abdominal findings. [DW]    Clinical Course User Index [DW] Janith Lima, MD     FINAL CLINICAL IMPRESSION(S) / ED DIAGNOSES   Final diagnoses:  Left lower quadrant abdominal pain  Nausea and vomiting, unspecified vomiting type     Rx / DC Orders   ED Discharge Orders     None        Note:  This document was prepared using Dragon voice recognition software and may include unintentional dictation errors.   Janith Lima, MD 04/14/23 [phone]

## 2023-04-30 ENCOUNTER — Encounter: Payer: Self-pay | Admitting: Nurse Practitioner

## 2023-05-05 ENCOUNTER — Ambulatory Visit
Admission: RE | Admit: 2023-05-05 | Discharge: 2023-05-05 | Disposition: A | Discharge status: Home / Self Care | Payer: PRIVATE HEALTH INSURANCE | Source: Ambulatory Visit | Attending: Nurse Practitioner | Admitting: Nurse Practitioner

## 2023-05-05 DIAGNOSIS — R748 Abnormal levels of other serum enzymes: Secondary | ICD-10-CM

## 2023-05-05 DIAGNOSIS — K769 Liver disease, unspecified: Secondary | ICD-10-CM

## 2023-05-05 MED ORDER — GADOPICLENOL 0.5 MMOL/ML IV SOLN
10.0000 mL | Freq: Once | INTRAVENOUS | Status: AC | PRN
  Administered 2023-05-05: 10 mL via INTRAVENOUS
